# Patient Record
Sex: Male | Born: 1985 | Race: White | Hispanic: No | Marital: Married
Health system: Southern US, Academic
[De-identification: ages and names within clinical notes are randomized; demographics above are authoritative.]

## PROBLEM LIST (undated history)

## (undated) ENCOUNTER — Encounter: Attending: Nurse Practitioner | Primary: Nurse Practitioner

## (undated) ENCOUNTER — Ambulatory Visit: Payer: MEDICARE

## (undated) ENCOUNTER — Encounter: Attending: Hematology & Oncology | Primary: Hematology & Oncology

## (undated) ENCOUNTER — Ambulatory Visit

## (undated) ENCOUNTER — Telehealth

## (undated) ENCOUNTER — Encounter: Attending: Cardiovascular Disease | Primary: Cardiovascular Disease

## (undated) ENCOUNTER — Encounter

## (undated) ENCOUNTER — Encounter: Attending: Family | Primary: Family

## (undated) ENCOUNTER — Inpatient Hospital Stay

## (undated) ENCOUNTER — Ambulatory Visit: Payer: MEDICARE | Attending: Nurse Practitioner | Primary: Nurse Practitioner

## (undated) ENCOUNTER — Ambulatory Visit: Payer: MEDICARE | Attending: Cardiovascular Disease | Primary: Cardiovascular Disease

## (undated) ENCOUNTER — Other Ambulatory Visit

## (undated) ENCOUNTER — Ambulatory Visit: Payer: MEDICARE | Attending: Family | Primary: Family

## (undated) ENCOUNTER — Encounter: Attending: Clinical Cardiac Electrophysiology | Primary: Clinical Cardiac Electrophysiology

## (undated) ENCOUNTER — Telehealth: Attending: Nurse Practitioner | Primary: Nurse Practitioner

## (undated) DIAGNOSIS — I10 Essential (primary) hypertension: Secondary | ICD-10-CM

## (undated) DIAGNOSIS — M109 Gout, unspecified: Secondary | ICD-10-CM

## (undated) DIAGNOSIS — I429 Cardiomyopathy, unspecified: Secondary | ICD-10-CM

## (undated) DIAGNOSIS — Z95810 Presence of automatic (implantable) cardiac defibrillator: Secondary | ICD-10-CM

## (undated) HISTORY — PX: ICD IMPLANT: EP1208

---
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
## 2005-05-05 ENCOUNTER — Emergency Department: Payer: Self-pay | Admitting: Emergency Medicine

## 2005-05-15 ENCOUNTER — Emergency Department (HOSPITAL_COMMUNITY): Admission: EM | Admit: 2005-05-15 | Discharge: 2005-05-16 | Payer: Self-pay | Admitting: Emergency Medicine

## 2005-05-22 ENCOUNTER — Emergency Department (HOSPITAL_COMMUNITY): Admission: EM | Admit: 2005-05-22 | Discharge: 2005-05-22 | Payer: Self-pay | Admitting: Emergency Medicine

## 2005-12-21 ENCOUNTER — Observation Stay (HOSPITAL_COMMUNITY): Admission: EM | Admit: 2005-12-21 | Discharge: 2005-12-22 | Payer: Self-pay | Admitting: Emergency Medicine

## 2007-02-25 ENCOUNTER — Ambulatory Visit: Payer: Self-pay | Admitting: Family Medicine

## 2007-03-05 ENCOUNTER — Ambulatory Visit: Payer: Self-pay | Admitting: Family Medicine

## 2007-03-05 DIAGNOSIS — R5381 Other malaise: Secondary | ICD-10-CM | POA: Insufficient documentation

## 2007-03-05 DIAGNOSIS — R5383 Other fatigue: Secondary | ICD-10-CM

## 2007-03-05 DIAGNOSIS — K219 Gastro-esophageal reflux disease without esophagitis: Secondary | ICD-10-CM | POA: Insufficient documentation

## 2007-03-05 DIAGNOSIS — R05 Cough: Secondary | ICD-10-CM

## 2007-03-06 ENCOUNTER — Telehealth (INDEPENDENT_AMBULATORY_CARE_PROVIDER_SITE_OTHER): Payer: Self-pay | Admitting: *Deleted

## 2007-03-06 LAB — CONVERTED CEMR LAB
Albumin: 4.4 g/dL (ref 3.5–5.2)
CO2: 27 meq/L (ref 19–32)
Eosinophils Relative: 3 % (ref 0–5)
Glucose, Bld: 64 mg/dL — ABNORMAL LOW (ref 70–99)
HCT: 45.6 % (ref 39.0–52.0)
Lymphocytes Relative: 35 % (ref 12–46)
Lymphs Abs: 1.8 10*3/uL (ref 0.7–4.0)
Neutrophils Relative %: 51 % (ref 43–77)
Platelets: 275 10*3/uL (ref 150–400)
Potassium: 4.6 meq/L (ref 3.5–5.3)
Sodium: 142 meq/L (ref 135–145)
TSH: 1.257 microintl units/mL (ref 0.350–5.50)
Total Protein: 7.1 g/dL (ref 6.0–8.3)
WBC: 5 10*3/uL (ref 4.0–10.5)

## 2007-03-08 ENCOUNTER — Encounter (INDEPENDENT_AMBULATORY_CARE_PROVIDER_SITE_OTHER): Payer: Self-pay | Admitting: Family Medicine

## 2007-05-26 ENCOUNTER — Encounter (INDEPENDENT_AMBULATORY_CARE_PROVIDER_SITE_OTHER): Payer: Self-pay | Admitting: Family Medicine

## 2010-09-07 NOTE — H&P (Signed)
NAMETERI, DILTZ         ACCOUNT NO.:  [id number]   MEDICAL RECORD NO.:  [id number]          PATIENT TYPE:  OBV   LOCATION:  1824                         FACILITY:  [facility]   PHYSICIAN:  Corinna L. Lendell Caprice, MDDATE OF BIRTH:  08-08-1985   DATE OF ADMISSION:  12/21/2005  DATE OF DISCHARGE:                                HISTORY & PHYSICAL   CHIEF COMPLAINT:  Chemical exposure.   HISTORY OF PRESENT ILLNESS:  Mr. Meister is a 25 year old, unassigned, white  male who presents to the emergency room after having woken up from a nap.  His mother was mixing pool chemicals and he reportedly felt some slight  shortness of breath and pain with inspiration.  He is somnolent and does not  provide a lot of history.  He has no history of asthma.  He has no rash.  He  did not take any medications other than as prescribed today.  Apparently,  his some of his family members presented to the emergency room as well for  the chemical exposure.   PAST MEDICAL HISTORY:  1. ADD.  2. Bipolar disorder.  3. Hypertension.   MEDICATIONS:  1. Strattera, dose unknown.  2. Hydrochlorothiazide, dose unknown.  3. Lexapro.  He believes 10 mg a day.  4. Ativan, dose unknown.  He thinks his doctor might have taken him off      this recently.   NO KNOWN DRUG ALLERGIES.   SOCIAL HISTORY:  He denies drinking, smoking, drug abuse.  He lives with his  family.  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   FAMILY HISTORY:  Noncontributory.   REVIEW OF SYSTEMS:  As above.  Otherwise negative.   PHYSICAL EXAMINATION:  VITAL SIGNS:  His temperature is 97.6, blood pressure  131/65, pulse 99, respiratory rate 20, oxygen saturation initially was 90%  on room air and is currently 94% on 2 liters nasal cannula oxygen.  He  reportedly drops his oxygen saturation into the 80s while off oxygen  according to the ED physician.  GENERAL:  The patient is a somnolent white male, easily arousable.  HEENT:  Normocephalic atraumatic.  Pupils  are equal, round, and reactive to  light.  Sclerae nonicteric.  Moist mucous membranes.  NECK:  Supple.  No lymphadenopathy.  LUNGS:  Clear to auscultation bilaterally without wheezes, rhonchi, or  rales.  CARDIOVASCULAR:  Regular rate and rhythm without murmurs, gallops, rubs.  ABDOMEN:  Normal bowel sounds.  Soft, nontender, nondistended.  GU:  Deferred.  RECTAL:  Deferred.  EXTREMITIES:  No clubbing, cyanosis, or edema.  SKIN:  No rash.  PSYCHIATRIC:  Cooperative.  NEUROLOGIC:  Sleepy but oriented.  Cranial nerves and sensory motor exams  were intact.   LABORATORY:  H&H normal.  Sodium 139, potassium 3.4, chloride 104,  bicarbonate 27, glucose 126, BUN 22, creatinine 0.9.  Urine drug screen  positive for benzodiazepines.  Chest x-ray shows nothing acute.   ASSESSMENT/PLAN:  1. Chlorine exposure.  It is uncertain what types of chemicals were being      mixed but the emergency department physician discussed the case with      Poison Control.  They recommended admission for observation as he is at      risk for a chemical pneumonitis.  This is reasonable particularly given      his borderline oxygen saturations.  I will check an ABG, place him on      observation on telemetry, check another chest x-ray in the morning, do      neruo checks every four hours, and hold his medications for now.  2. Hypertension.  3. Bipolar disorder.  4. Attention deficit disorder.      Corinna L. Lendell Caprice, MD  Electronically Signed     CLS/MEDQ  D:  12/21/2005  T:  12/21/2005  Job:  [id number]

## 2010-12-22 ENCOUNTER — Emergency Department: Payer: Self-pay | Admitting: Emergency Medicine

## 2012-09-22 DIAGNOSIS — I421 Obstructive hypertrophic cardiomyopathy: Secondary | ICD-10-CM | POA: Diagnosis present

## 2014-06-07 ENCOUNTER — Ambulatory Visit (HOSPITAL_COMMUNITY)
Admission: RE | Admit: 2014-06-07 | Discharge: 2014-06-07 | Disposition: A | Discharge status: Home / Self Care | Payer: Medicare Other | Source: Ambulatory Visit | Attending: Family Medicine | Admitting: Family Medicine

## 2014-06-07 ENCOUNTER — Other Ambulatory Visit (HOSPITAL_COMMUNITY): Payer: Self-pay | Admitting: Family Medicine

## 2014-06-07 DIAGNOSIS — J189 Pneumonia, unspecified organism: Secondary | ICD-10-CM | POA: Insufficient documentation

## 2014-09-05 ENCOUNTER — Other Ambulatory Visit (HOSPITAL_COMMUNITY): Payer: Self-pay | Admitting: Family Medicine

## 2014-09-05 ENCOUNTER — Ambulatory Visit (HOSPITAL_COMMUNITY)
Admission: RE | Admit: 2014-09-05 | Discharge: 2014-09-05 | Disposition: A | Discharge status: Home / Self Care | Payer: Medicare Other | Source: Ambulatory Visit | Attending: Family Medicine | Admitting: Family Medicine

## 2014-09-05 DIAGNOSIS — M79671 Pain in right foot: Secondary | ICD-10-CM | POA: Diagnosis present

## 2016-02-14 ENCOUNTER — Emergency Department (HOSPITAL_COMMUNITY)
Admission: EM | Admit: 2016-02-14 | Discharge: 2016-02-14 | Disposition: A | Discharge status: Home / Self Care | Payer: Medicare Other | Attending: Emergency Medicine | Admitting: Emergency Medicine

## 2016-02-14 ENCOUNTER — Encounter (HOSPITAL_COMMUNITY): Payer: Self-pay | Admitting: Emergency Medicine

## 2016-02-14 ENCOUNTER — Emergency Department (HOSPITAL_COMMUNITY): Payer: Medicare Other

## 2016-02-14 DIAGNOSIS — Z79899 Other long term (current) drug therapy: Secondary | ICD-10-CM | POA: Diagnosis not present

## 2016-02-14 DIAGNOSIS — S9032XA Contusion of left foot, initial encounter: Secondary | ICD-10-CM | POA: Diagnosis not present

## 2016-02-14 DIAGNOSIS — Y999 Unspecified external cause status: Secondary | ICD-10-CM | POA: Insufficient documentation

## 2016-02-14 DIAGNOSIS — Y9389 Activity, other specified: Secondary | ICD-10-CM | POA: Insufficient documentation

## 2016-02-14 DIAGNOSIS — W208XXA Other cause of strike by thrown, projected or falling object, initial encounter: Secondary | ICD-10-CM | POA: Diagnosis not present

## 2016-02-14 DIAGNOSIS — Y929 Unspecified place or not applicable: Secondary | ICD-10-CM | POA: Diagnosis not present

## 2016-02-14 DIAGNOSIS — S99922A Unspecified injury of left foot, initial encounter: Secondary | ICD-10-CM | POA: Diagnosis present

## 2016-02-14 HISTORY — DX: Presence of automatic (implantable) cardiac defibrillator: Z95.810

## 2016-02-14 HISTORY — DX: Cardiomyopathy, unspecified: I42.9

## 2016-02-14 NOTE — ED Provider Notes (Signed)
[facility] DEPT Provider Note   CSN: [id number] Arrival date & time: 02/14/16  1831     History   Chief Complaint Chief Complaint  Patient presents with  . Foot Pain    HPI Christopher Nichols is a 30 y.o. male.  The history is provided by the patient. No language interpreter was used.  Foot Pain  This is a new problem. The current episode started 6 to 12 hours ago. The problem occurs constantly. The problem has been gradually worsening. Nothing aggravates the symptoms. Nothing relieves the symptoms. He has tried nothing for the symptoms.  Pt reports he was cutting a tree limb and it fell on his foot.  Pt complains of swelling and bruising.  Past Medical History:  Diagnosis Date  . Cardiac defibrillator in place   . Cardiomyopathy [facility](HCC)     Patient Active Problem List   Diagnosis Date Noted  . GERD 03/05/2007  . MALAISE AND FATIGUE 03/05/2007  . COUGH 03/05/2007    History reviewed. No pertinent surgical history.     Home Medications    Prior to Admission medications   Medication Sig Start Date End Date Taking? Authorizing Provider  cetirizine (ZYRTEC) 10 MG tablet Take 10 mg by mouth daily.   Yes Historical Provider, MD  metoprolol (LOPRESSOR) 100 MG tablet Take 100 mg by mouth 2 (two) times daily.  08/24/15  Yes Historical Provider, MD  verapamil (VERELAN) 240 MG 24 hr capsule Take 240 mg by mouth 2 (two) times daily.   Yes Historical Provider, MD    Family History History reviewed. No pertinent family history.  Social History Social History  Substance Use Topics  . Smoking status: Never Smoker  . Smokeless tobacco: Never Used  . Alcohol use No     Allergies   Clarithromycin   Review of Systems Review of Systems  All other systems reviewed and are negative.    Physical Exam Updated Vital Signs BP 125/66 (BP Location: Right Arm)   Pulse 74   Temp 98.4 F (36.9 C) (Oral)   Resp 18   Ht 6' (1.829 m)   Wt 104.3 kg   SpO2 98%   BMI  31.19 kg/m   Physical Exam  Constitutional: He appears well-developed and well-nourished.  HENT:  Head: Normocephalic.  Musculoskeletal: He exhibits tenderness. He exhibits no deformity.  Bruising dorsal aspect of left foot,  Pain to palpation nv and ns intact  Neurological: He is alert.  Skin: Skin is warm.  Nursing note and vitals reviewed.    ED Treatments / Results  Labs (all labs ordered are listed, but only abnormal results are displayed) Labs Reviewed - No data to display  EKG  EKG Interpretation None       Radiology Dg Foot Complete Left  Result Date: 02/14/2016 CLINICAL DATA:  Fall, foot laceration EXAM: LEFT FOOT - COMPLETE 3+ VIEW COMPARISON:  None. FINDINGS: No fracture or dislocation is seen. The joint spaces are preserved. Mild soft tissue swelling overlying the dorsal forefoot. No radiopaque foreign body is seen. IMPRESSION: No fracture, dislocation, or radiopaque foreign body is seen. Mild dorsal soft tissue swelling. Electronically Signed   By: Charline BillsSriyesh  Krishnan M.D.   On: 02/14/2016 19:39    Procedures Procedures (including critical care time)  Medications Ordered in ED Medications - No data to display   Initial Impression / Assessment and Plan / ED Course  I have reviewed the triage vital signs and the nursing notes.  Pertinent labs & imaging results  that were available during my care of the patient were reviewed by me and considered in my medical decision making (see chart for details).  Clinical Course      Final Clinical Impressions(s) / ED Diagnoses   Final diagnoses:  Contusion of left foot, initial encounter    New Prescriptions Discharge Medication List as of 02/14/2016  8:49 PM    ace wrap Pt advised to followup for recheck with his MD if pain persist past one week.   Ice An After Visit Summary was printed and given to the patient.   Lonia Skinner [place], PA-C 02/14/16 2346    Mancel Bale, MD 02/15/16 [phone]

## 2016-02-14 NOTE — ED Triage Notes (Signed)
Patient states he was cutting limbs and a large limb fell on his left foot.

## 2016-02-14 NOTE — Discharge Instructions (Signed)
See your Physician for recheck.  Return if any problems.  °

## 2016-02-14 NOTE — ED Notes (Signed)
Pt ambulatory to restroom at this time. 

## 2016-11-19 ENCOUNTER — Ambulatory Visit
Admission: RE | Admit: 2016-11-19 | Discharge: 2016-11-19 | Disposition: A | Discharge status: Home / Self Care | Payer: MEDICARE | Attending: Cardiovascular Disease | Admitting: Cardiovascular Disease

## 2016-11-19 DIAGNOSIS — I421 Obstructive hypertrophic cardiomyopathy: Principal | ICD-10-CM

## 2017-01-01 MED ORDER — VERAPAMIL ER 240 MG 24 HR CAPSULE,EXTENDED RELEASE
ORAL_CAPSULE | 2 refills | 0 days | Status: CP
Start: 2017-01-01 — End: 2017-05-09

## 2017-02-18 ENCOUNTER — Other Ambulatory Visit (HOSPITAL_COMMUNITY): Payer: Self-pay | Admitting: Family Medicine

## 2017-02-18 ENCOUNTER — Ambulatory Visit (HOSPITAL_COMMUNITY)
Admission: RE | Admit: 2017-02-18 | Discharge: 2017-02-18 | Disposition: A | Discharge status: Home / Self Care | Payer: Medicare Other | Source: Ambulatory Visit | Attending: Family Medicine | Admitting: Family Medicine

## 2017-02-18 DIAGNOSIS — R05 Cough: Secondary | ICD-10-CM | POA: Diagnosis not present

## 2017-02-18 DIAGNOSIS — R059 Cough, unspecified: Secondary | ICD-10-CM

## 2017-03-10 MED ORDER — METOPROLOL TARTRATE 100 MG TABLET
ORAL_TABLET | 1 refills | 0 days | Status: CP
Start: 2017-03-10 — End: 2018-01-06

## 2017-03-11 ENCOUNTER — Ambulatory Visit
Admission: RE | Admit: 2017-03-11 | Discharge: 2017-03-11 | Disposition: A | Discharge status: Home / Self Care | Payer: MEDICARE | Attending: Adult Health | Admitting: Adult Health

## 2017-03-11 DIAGNOSIS — I429 Cardiomyopathy, unspecified: Principal | ICD-10-CM

## 2017-05-09 ENCOUNTER — Ambulatory Visit
Admit: 2017-05-09 | Discharge: 2017-05-10 | Payer: MEDICARE | Attending: Cardiovascular Disease | Primary: Cardiovascular Disease

## 2017-05-09 DIAGNOSIS — R002 Palpitations: Principal | ICD-10-CM

## 2017-05-09 MED ORDER — CEPHALEXIN 500 MG CAPSULE
ORAL_CAPSULE | Freq: Three times a day (TID) | ORAL | 0 refills | 0 days | Status: CP
Start: 2017-05-09 — End: 2017-05-14

## 2017-05-13 MED ORDER — VERAPAMIL ER 240 MG 24 HR CAPSULE,EXTENDED RELEASE
ORAL_CAPSULE | 0 refills | 0 days | Status: CP
Start: 2017-05-13 — End: 2017-05-20

## 2017-05-20 ENCOUNTER — Ambulatory Visit
Admit: 2017-05-20 | Discharge: 2017-05-21 | Payer: MEDICARE | Attending: Cardiovascular Disease | Primary: Cardiovascular Disease

## 2017-05-20 DIAGNOSIS — I421 Obstructive hypertrophic cardiomyopathy: Principal | ICD-10-CM

## 2017-05-20 DIAGNOSIS — R002 Palpitations: Secondary | ICD-10-CM

## 2017-05-20 MED ORDER — METOPROLOL TARTRATE 100 MG TABLET
ORAL_TABLET | Freq: Two times a day (BID) | ORAL | 3 refills | 0 days | Status: CP
Start: 2017-05-20 — End: 2018-01-15

## 2017-05-20 MED ORDER — VERAPAMIL ER 240 MG 24 HR CAPSULE,EXTENDED RELEASE
ORAL_CAPSULE | Freq: Two times a day (BID) | ORAL | 3 refills | 0.00000 days | Status: CP
Start: 2017-05-20 — End: 2018-01-06

## 2018-01-06 ENCOUNTER — Ambulatory Visit: Admit: 2018-01-06 | Discharge: 2018-01-07 | Payer: MEDICARE

## 2018-01-06 DIAGNOSIS — K219 Gastro-esophageal reflux disease without esophagitis: Secondary | ICD-10-CM

## 2018-01-06 DIAGNOSIS — R0602 Shortness of breath: Secondary | ICD-10-CM

## 2018-01-06 DIAGNOSIS — R079 Chest pain, unspecified: Secondary | ICD-10-CM

## 2018-01-06 DIAGNOSIS — I319 Disease of pericardium, unspecified: Secondary | ICD-10-CM

## 2018-01-06 DIAGNOSIS — R002 Palpitations: Secondary | ICD-10-CM

## 2018-01-06 DIAGNOSIS — I421 Obstructive hypertrophic cardiomyopathy: Principal | ICD-10-CM

## 2018-01-06 DIAGNOSIS — F70 Mild intellectual disabilities: Secondary | ICD-10-CM

## 2018-01-06 MED ORDER — METOPROLOL TARTRATE 100 MG TABLET
ORAL_TABLET | Freq: Two times a day (BID) | ORAL | 3 refills | 0.00000 days | Status: CP
Start: 2018-01-06 — End: ?

## 2018-01-06 MED ORDER — OMEPRAZOLE 20 MG CAPSULE,DELAYED RELEASE
ORAL_CAPSULE | Freq: Two times a day (BID) | ORAL | 3 refills | 0.00000 days | Status: CP
Start: 2018-01-06 — End: ?

## 2018-01-06 MED ORDER — VERAPAMIL ER 240 MG 24 HR CAPSULE,EXTENDED RELEASE
ORAL_CAPSULE | Freq: Two times a day (BID) | ORAL | 3 refills | 0 days | Status: CP
Start: 2018-01-06 — End: ?

## 2018-01-15 ENCOUNTER — Ambulatory Visit
Admit: 2018-01-15 | Discharge: 2018-01-16 | Payer: MEDICARE | Attending: Cardiovascular Disease | Primary: Cardiovascular Disease

## 2018-01-15 DIAGNOSIS — R002 Palpitations: Principal | ICD-10-CM

## 2018-05-06 ENCOUNTER — Institutional Professional Consult (permissible substitution): Admit: 2018-05-06 | Discharge: 2018-05-07 | Payer: MEDICARE

## 2018-05-06 DIAGNOSIS — Z45018 Encounter for adjustment and management of other part of cardiac pacemaker: Principal | ICD-10-CM

## 2018-06-23 ENCOUNTER — Ambulatory Visit: Admit: 2018-06-23 | Discharge: 2018-06-24 | Payer: MEDICARE

## 2018-06-23 DIAGNOSIS — I421 Obstructive hypertrophic cardiomyopathy: Principal | ICD-10-CM

## 2018-06-23 DIAGNOSIS — I10 Essential (primary) hypertension: Principal | ICD-10-CM

## 2018-06-23 DIAGNOSIS — F79 Unspecified intellectual disabilities: Principal | ICD-10-CM

## 2018-06-23 DIAGNOSIS — R002 Palpitations: Principal | ICD-10-CM

## 2018-06-23 DIAGNOSIS — I422 Other hypertrophic cardiomyopathy: Principal | ICD-10-CM

## 2018-06-23 DIAGNOSIS — F909 Attention-deficit hyperactivity disorder, unspecified type: Principal | ICD-10-CM

## 2019-03-23 ENCOUNTER — Ambulatory Visit: Admit: 2019-03-23 | Discharge: 2019-03-23 | Payer: MEDICARE

## 2019-03-23 DIAGNOSIS — F70 Mild intellectual disabilities: Principal | ICD-10-CM

## 2019-03-23 DIAGNOSIS — I421 Obstructive hypertrophic cardiomyopathy: Principal | ICD-10-CM

## 2019-03-23 DIAGNOSIS — R002 Palpitations: Principal | ICD-10-CM

## 2019-03-23 DIAGNOSIS — R079 Chest pain, unspecified: Principal | ICD-10-CM

## 2019-03-23 DIAGNOSIS — R0602 Shortness of breath: Principal | ICD-10-CM

## 2019-04-01 ENCOUNTER — Other Ambulatory Visit: Payer: Self-pay

## 2019-04-01 DIAGNOSIS — Z20822 Contact with and (suspected) exposure to covid-19: Secondary | ICD-10-CM

## 2019-04-02 ENCOUNTER — Telehealth: Payer: Self-pay | Admitting: *Deleted

## 2019-04-02 NOTE — Telephone Encounter (Signed)
Patient called for results ,still pending . 

## 2019-04-03 LAB — NOVEL CORONAVIRUS, NAA: SARS-CoV-2, NAA: NOT DETECTED

## 2019-04-21 ENCOUNTER — Ambulatory Visit: Payer: Medicare Other | Attending: Internal Medicine

## 2019-04-21 DIAGNOSIS — Z20822 Contact with and (suspected) exposure to covid-19: Secondary | ICD-10-CM

## 2019-04-22 ENCOUNTER — Telehealth: Payer: Self-pay

## 2019-04-22 NOTE — Telephone Encounter (Signed)
Patient called in requesting [facility] lab results  - DOB/Address verified - Results pending.  Reviewed testing process with patient; assisted with Mychart setup, no further questions.

## 2019-04-23 LAB — NOVEL CORONAVIRUS, NAA: SARS-CoV-2, NAA: DETECTED — AB

## 2019-05-10 ENCOUNTER — Other Ambulatory Visit: Payer: Self-pay

## 2019-05-10 ENCOUNTER — Ambulatory Visit
Admission: EM | Admit: 2019-05-10 | Discharge: 2019-05-10 | Disposition: A | Discharge status: Home / Self Care | Payer: Medicare Other

## 2019-05-10 DIAGNOSIS — M109 Gout, unspecified: Secondary | ICD-10-CM | POA: Diagnosis not present

## 2019-05-10 MED ORDER — COLCHICINE 0.6 MG PO TABS: 0.6000 mg | ORAL_TABLET | Freq: Every day | ORAL | 0 refills | Status: DC

## 2019-05-10 NOTE — Discharge Instructions (Addendum)
Rest, ice, and elevate Prescribed colchicine take as directed and to completion Take two doses and then wait a hour and then take third dose Follow up with PCP for further evaluation and management Return or go to the ER if you have any new or worsening symptoms (fever, chills, chest pain, redness, swelling, worsening symptoms despite medication, etc...)

## 2019-05-10 NOTE — ED Provider Notes (Signed)
[facility]ARE [facility]   [id number] 05/10/19 Arrival Time: 1315  CC: Hand PAIN  SUBJECTIVE: History from: patient. Christopher Nichols is a 34 y.o. male hx significant for cardiomyopathy with defibrillator, complains of left great toe pain that began 4 days ago.  Denies a precipitating event or specific injury.  Localizes the pain to the LT great toe.  Describes the pain as intermittent and sharp in character.  Has tried OTC medications without relief.  Symptoms are made worse with weight-bearing.  Reports similar symptoms in the past with gout and treated with colchicine with relief.  Was taking verapamil at that time as well.  Complains of associated swelling, and redness, warm to the touch.  Denies fever, chills, ecchymosis, weakness, numbness and tingling.    ROS: As per HPI.  All other pertinent ROS negative.     Past Medical History:  Diagnosis Date  . Cardiac defibrillator in place   . Cardiomyopathy [facility])    History reviewed. No pertinent surgical history. Allergies  Allergen Reactions  . Clarithromycin     REACTION: Stomach upset   No current facility-administered medications on file prior to encounter.   Current Outpatient Medications on File Prior to Encounter  Medication Sig Dispense Refill  . naproxen sodium (ALEVE) 220 MG tablet Take 220 mg by mouth.    Marland Kitchen omeprazole (PRILOSEC) 20 MG capsule Take 20 mg by mouth daily.    . cetirizine (ZYRTEC) 10 MG tablet Take 10 mg by mouth daily.    . metoprolol (LOPRESSOR) 100 MG tablet Take 100 mg by mouth 2 (two) times daily.     . verapamil (VERELAN) 240 MG 24 hr capsule Take 240 mg by mouth 2 (two) times daily.     Social History   Socioeconomic History  . Marital status: Single    Spouse name: Not on file  . Number of children: Not on file  . Years of education: Not on file  . Highest education level: Not on file  Occupational History  . Not on file  Tobacco Use  . Smoking status: Never Smoker  . Smokeless tobacco:  Never Used  Substance and Sexual Activity  . Alcohol use: No  . Drug use: No  . Sexual activity: Not on file  Other Topics Concern  . Not on file  Social History Narrative  . Not on file   Social Determinants of Health   Financial Resource Strain:   . Difficulty of Paying Living Expenses: Not on file  Food Insecurity:   . Worried About Programme researcher, broadcasting/film/video in the Last Year: Not on file  . Ran Out of Food in the Last Year: Not on file  Transportation Needs:   . Lack of Transportation (Medical): Not on file  . Lack of Transportation (Non-Medical): Not on file  Physical Activity:   . Days of Exercise per Week: Not on file  . Minutes of Exercise per Session: Not on file  Stress:   . Feeling of Stress : Not on file  Social Connections:   . Frequency of Communication with Friends and Family: Not on file  . Frequency of Social Gatherings with Friends and Family: Not on file  . Attends Religious Services: Not on file  . Active Member of Clubs or Organizations: Not on file  . Attends Banker Meetings: Not on file  . Marital Status: Not on file  Intimate Partner Violence:   . Fear of Current or Ex-Partner: Not on file  . Emotionally Abused:  Not on file  . Physically Abused: Not on file  . Sexually Abused: Not on file   Family History  Problem Relation Age of Onset  . Healthy Mother   . Healthy Father     OBJECTIVE:  Vitals:   05/10/19 1408  BP: 111/76  Pulse: 61  Resp: 16  Temp: 97.8 F (36.6 C)  TempSrc: Oral  SpO2: 97%    General appearance: ALERT; in no acute distress.  Head: NCAT Lungs: Normal respiratory effort CV: Dorsalis pedis pulses 2+. Cap refill < 2 seconds Musculoskeletal: RT foot Inspection: Skin warm, dry, clear and intact without obvious erythema, effusion, or ecchymosis.  Palpation: TTP over 1st MTP joint ROM: LROM about the great toe Strength: 5/5 dorsiflexion, 5/5 plantar flexion Skin: warm and dry Neurologic: Ambulates without  difficulty; Sensation intact about the lower extremities Psychological: alert and cooperative; normal mood and affect   ASSESSMENT & PLAN:  1. Acute gout involving toe of left foot, unspecified cause     Meds ordered this encounter  Medications  . colchicine 0.6 MG tablet    Sig: Take 1 tablet (0.6 mg total) by mouth daily.    Dispense:  10 tablet    Refill:  0    Order Specific Question:   Supervising Provider    Answer:   Raylene Everts [[id number]]   Rest, ice, and elevate Prescribed colchicine take as directed and to completion Offered patient and mother alternative to colchicine given possible interaction with verapamil.  States patient has tolerated colchicine in the past without complications.  Aware of risk of associated with decision.  Would like to proceed with colchicine.   Take two doses and then wait a hour and then take third dose Follow up with PCP for further evaluation and management Return or go to the ER if you have any new or worsening symptoms (fever, chills, chest pain, redness, swelling, worsening symptoms despite medication, etc...)    Reviewed expectations re: course of current medical issues. Questions answered. Outlined signs and symptoms indicating need for more acute intervention. Patient verbalized understanding. After Visit Summary given.    Lestine Box, PA-C 05/10/19 1700

## 2019-05-10 NOTE — ED Triage Notes (Signed)
Pt presents to UC w/ c/o gout flare up in left leg x5 days. Pt had covid 2 weeks ago and mother believes it is flaring up his gout which he hasn't had in years.

## 2019-07-18 ENCOUNTER — Ambulatory Visit: Payer: Medicare Other

## 2019-10-05 ENCOUNTER — Ambulatory Visit: Admit: 2019-10-05 | Discharge: 2019-10-06 | Payer: MEDICARE

## 2019-10-05 MED ORDER — COLCHICINE 0.6 MG TABLET
ORAL_TABLET | Freq: Every day | ORAL | 3 refills | 30.00000 days | Status: CP
Start: 2019-10-05 — End: 2019-11-04

## 2019-10-05 MED ORDER — VERAPAMIL ER 240 MG 24 HR CAPSULE,EXTENDED RELEASE
ORAL_CAPSULE | Freq: Two times a day (BID) | ORAL | 3 refills | 0 days | Status: CP
Start: 2019-10-05 — End: ?

## 2019-10-05 MED ORDER — METOPROLOL TARTRATE 100 MG TABLET
ORAL_TABLET | Freq: Two times a day (BID) | ORAL | 3 refills | 90 days | Status: CP
Start: 2019-10-05 — End: ?

## 2019-10-06 MED ORDER — COLCHICINE 0.6 MG TABLET
ORAL_TABLET | Freq: Every day | ORAL | 3 refills | 90.00000 days | Status: CP
Start: 2019-10-06 — End: 2020-10-05

## 2019-10-07 ENCOUNTER — Ambulatory Visit
Admit: 2019-10-07 | Discharge: 2019-10-08 | Payer: MEDICARE | Attending: Cardiovascular Disease | Primary: Cardiovascular Disease

## 2019-12-23 ENCOUNTER — Ambulatory Visit
Admission: EM | Admit: 2019-12-23 | Discharge: 2019-12-23 | Disposition: A | Discharge status: Home / Self Care | Payer: Medicare Other | Attending: Emergency Medicine | Admitting: Emergency Medicine

## 2019-12-23 ENCOUNTER — Encounter: Payer: Self-pay | Admitting: Emergency Medicine

## 2019-12-23 ENCOUNTER — Other Ambulatory Visit: Payer: Self-pay

## 2019-12-23 DIAGNOSIS — S60453A Superficial foreign body of left middle finger, initial encounter: Secondary | ICD-10-CM

## 2019-12-23 MED ORDER — TETANUS-DIPHTH-ACELL PERTUSSIS 5-2.5-18.5 LF-MCG/0.5 IM SUSP
0.5000 mL | Freq: Once | INTRAMUSCULAR | Status: AC
  Administered 2019-12-23: 0.5 mL via INTRAMUSCULAR

## 2019-12-23 MED ORDER — MUPIROCIN 2 % EX OINT: 1.0000 "application " | TOPICAL_OINTMENT | Freq: Two times a day (BID) | CUTANEOUS | 0 refills | Status: DC

## 2019-12-23 MED ORDER — CEPHALEXIN 500 MG PO CAPS: 500.0000 mg | ORAL_CAPSULE | Freq: Four times a day (QID) | ORAL | 0 refills | Status: DC

## 2019-12-23 NOTE — ED Triage Notes (Signed)
Fishing lure stuck in Lt middle finger

## 2019-12-23 NOTE — ED Provider Notes (Signed)
[facility] CARE [facility]ENTER   [id number] 12/23/19 Arrival Time: 1739   BJ:[id number]  SUBJECTIVE:  Christopher Nichols is a 34 y.o. male who presented to the urgent care with a complaint of fishing hook stuck in the left middle finger that occurred today.  Report he was fishing and mistakenly grabbed the hook.  Located pain to his left middle finger.  Pain is worsening aching character.  Has not tried any OTC medication.  Denies similar symptoms in the past.  Denies chills, fever, nausea, vomiting, diarrhea.  Td Immunization status is unknown  ROS: As per HPI.  All other pertinent ROS negative.     Past Medical History:  Diagnosis Date  . Cardiac defibrillator in place   . Cardiomyopathy [facility])    History reviewed. No pertinent surgical history. Allergies  Allergen Reactions  . Clarithromycin     REACTION: Stomach upset   No current facility-administered medications on file prior to encounter.   Current Outpatient Medications on File Prior to Encounter  Medication Sig Dispense Refill  . cetirizine (ZYRTEC) 10 MG tablet Take 10 mg by mouth daily.    . colchicine 0.6 MG tablet Take 1 tablet (0.6 mg total) by mouth daily. 10 tablet 0  . metoprolol (LOPRESSOR) 100 MG tablet Take 100 mg by mouth 2 (two) times daily.     . naproxen sodium (ALEVE) 220 MG tablet Take 220 mg by mouth.    Marland Kitchen omeprazole (PRILOSEC) 20 MG capsule Take 20 mg by mouth daily.    . verapamil (VERELAN) 240 MG 24 hr capsule Take 240 mg by mouth 2 (two) times daily.     Social History   Socioeconomic History  . Marital status: Single    Spouse name: Not on file  . Number of children: Not on file  . Years of education: Not on file  . Highest education level: Not on file  Occupational History  . Not on file  Tobacco Use  . Smoking status: Never Smoker  . Smokeless tobacco: Never Used  Substance and Sexual Activity  . Alcohol use: No  . Drug use: No  . Sexual activity: Not on file  Other Topics Concern  . Not  on file  Social History Narrative  . Not on file   Social Determinants of Health   Financial Resource Strain:   . Difficulty of Paying Living Expenses: Not on file  Food Insecurity:   . Worried About Programme researcher, broadcasting/film/video in the Last Year: Not on file  . Ran Out of Food in the Last Year: Not on file  Transportation Needs:   . Lack of Transportation (Medical): Not on file  . Lack of Transportation (Non-Medical): Not on file  Physical Activity:   . Days of Exercise per Week: Not on file  . Minutes of Exercise per Session: Not on file  Stress:   . Feeling of Stress : Not on file  Social Connections:   . Frequency of Communication with Friends and Family: Not on file  . Frequency of Social Gatherings with Friends and Family: Not on file  . Attends Religious Services: Not on file  . Active Member of Clubs or Organizations: Not on file  . Attends Banker Meetings: Not on file  . Marital Status: Not on file  Intimate Partner Violence:   . Fear of Current or Ex-Partner: Not on file  . Emotionally Abused: Not on file  . Physically Abused: Not on file  . Sexually Abused: Not on file  Family History  Problem Relation Age of Onset  . Healthy Mother   . Healthy Father     OBJECTIVE:  Vitals:   12/23/19 1755  BP: 115/70  Pulse: 64  Resp: 16  Temp: 98.5 F (36.9 C)  TempSrc: Oral  SpO2: 98%     Physical Exam Vitals and nursing note reviewed.  Constitutional:      General: He is not in acute distress.    Appearance: Normal appearance. He is normal weight. He is not ill-appearing, toxic-appearing or diaphoretic.  Cardiovascular:     Rate and Rhythm: Normal rate and regular rhythm.     Pulses: Normal pulses.     Heart sounds: Normal heart sounds. No murmur heard.  No friction rub. No gallop.   Pulmonary:     Effort: Pulmonary effort is normal. No respiratory distress.     Breath sounds: Normal breath sounds. No stridor. No wheezing, rhonchi or rales.  Chest:       Chest wall: No tenderness.  Skin:    General: Skin is warm.     Capillary Refill: Capillary refill takes less than 2 seconds.     Comments: Fishing hook stuck into left anterior middle finger with a depth of 0.5 cm  Neurological:     Mental Status: He is alert and oriented to person, place, and time.     Procedure: Verbal consent obtained. Area was cleaned with betadine. Lidocaine 2% without epinephrine used to obtain local anesthesia. The nearest part of the skin near the hook was incised with a #11 blade scalpel.  The hook was then pushed out and then removed from the finger.  The wound was cleaned and dressed  with a clean gauze dressing. Minimal bleeding. No complications.  ASSESSMENT & PLAN:  1. Foreign body of left middle finger     Meds ordered this encounter  Medications  . Tdap (BOOSTRIX) injection 0.5 mL  . cephALEXin (KEFLEX) 500 MG capsule    Sig: Take 1 capsule (500 mg total) by mouth 4 (four) times daily.    Dispense:  20 capsule    Refill:  0  . mupirocin ointment (BACTROBAN) 2 %    Sig: Apply 1 application topically 2 (two) times daily.    Dispense:  22 g    Refill:  0   Discharge instructions  Was the site daily with warm water and mild soap Take antibiotic as prescribed and to completion Apply thin layer of Bactroban  Follow up here or with PCP if symptoms persists Return or go to the ED if you have any new or worsening symptoms increased redness, swelling, pain, nausea, vomiting, fever, chills, etc...    Reviewed expectations re: course of current medical issues. Questions answered. Outlined signs and symptoms indicating need for more acute intervention. Patient verbalized understanding. After Visit Summary given.       Note: This document was prepared using Dragon voice recognition software and may include unintentional dictation errors.    Durward Parcel, FNP 12/23/19 1850

## 2019-12-23 NOTE — Discharge Instructions (Addendum)
Was the site daily with warm water and mild soap Take antibiotic as prescribed and to completion Apply thin layer of Bactroban  Follow up here or with PCP if symptoms persists Return or go to the ED if you have any new or worsening symptoms increased redness, swelling, pain, nausea, vomiting, fever, chills, etc..Marland Kitchen

## 2020-01-12 ENCOUNTER — Institutional Professional Consult (permissible substitution): Admit: 2020-01-12 | Discharge: 2020-01-12 | Payer: MEDICARE

## 2020-01-12 DIAGNOSIS — I421 Obstructive hypertrophic cardiomyopathy: Principal | ICD-10-CM

## 2020-07-11 ENCOUNTER — Ambulatory Visit: Admit: 2020-07-11 | Discharge: 2020-07-11 | Payer: MEDICARE

## 2020-07-11 ENCOUNTER — Institutional Professional Consult (permissible substitution)
Admit: 2020-07-11 | Discharge: 2020-07-11 | Payer: MEDICARE | Attending: Nurse Practitioner | Primary: Nurse Practitioner

## 2020-07-11 DIAGNOSIS — R0602 Shortness of breath: Principal | ICD-10-CM

## 2020-07-11 DIAGNOSIS — Z4502 Encounter for adjustment and management of automatic implantable cardiac defibrillator: Principal | ICD-10-CM

## 2020-07-11 DIAGNOSIS — F70 Mild intellectual disabilities: Principal | ICD-10-CM

## 2020-07-11 DIAGNOSIS — R0683 Snoring: Principal | ICD-10-CM

## 2020-07-11 DIAGNOSIS — I421 Obstructive hypertrophic cardiomyopathy: Principal | ICD-10-CM

## 2020-07-11 DIAGNOSIS — K219 Gastro-esophageal reflux disease without esophagitis: Principal | ICD-10-CM

## 2020-07-11 MED ORDER — METOPROLOL TARTRATE 100 MG TABLET
ORAL_TABLET | Freq: Two times a day (BID) | ORAL | 3 refills | 90 days | Status: CP
Start: 2020-07-11 — End: ?

## 2020-07-11 MED ORDER — VERAPAMIL ER 240 MG 24 HR CAPSULE,EXTENDED RELEASE
ORAL_CAPSULE | Freq: Two times a day (BID) | ORAL | 3 refills | 0.00000 days | Status: CP
Start: 2020-07-11 — End: 2021-07-11

## 2020-08-18 ENCOUNTER — Institutional Professional Consult (permissible substitution)
Admit: 2020-08-18 | Discharge: 2020-08-19 | Payer: MEDICARE | Attending: Nurse Practitioner | Primary: Nurse Practitioner

## 2020-10-31 ENCOUNTER — Ambulatory Visit: Admit: 2020-10-31 | Discharge: 2020-10-31 | Payer: MEDICARE

## 2020-10-31 ENCOUNTER — Institutional Professional Consult (permissible substitution): Admit: 2020-10-31 | Discharge: 2020-10-31 | Payer: MEDICARE

## 2020-10-31 DIAGNOSIS — R002 Palpitations: Principal | ICD-10-CM

## 2020-11-01 DIAGNOSIS — Z95810 Presence of automatic (implantable) cardiac defibrillator: Secondary | ICD-10-CM | POA: Insufficient documentation

## 2020-11-20 ENCOUNTER — Institutional Professional Consult (permissible substitution)
Admit: 2020-11-20 | Discharge: 2020-11-21 | Payer: MEDICARE | Attending: Nurse Practitioner | Primary: Nurse Practitioner

## 2020-12-02 ENCOUNTER — Ambulatory Visit: Admit: 2020-12-02 | Discharge: 2020-12-03 | Disposition: A | Discharge status: Home / Self Care | Payer: MEDICARE

## 2020-12-05 DIAGNOSIS — Z4502 Encounter for adjustment and management of automatic implantable cardiac defibrillator: Principal | ICD-10-CM

## 2020-12-08 ENCOUNTER — Encounter: Admit: 2020-12-08 | Discharge: 2020-12-08 | Payer: MEDICARE

## 2020-12-08 ENCOUNTER — Encounter
Admit: 2020-12-08 | Discharge: 2020-12-08 | Payer: MEDICARE | Attending: Clinical Cardiac Electrophysiology | Primary: Clinical Cardiac Electrophysiology

## 2020-12-08 ENCOUNTER — Ambulatory Visit: Admit: 2020-12-08 | Discharge: 2020-12-08 | Payer: MEDICARE

## 2020-12-08 MED ORDER — OXYCODONE-ACETAMINOPHEN 5 MG-325 MG TABLET
ORAL_TABLET | Freq: Four times a day (QID) | ORAL | 0 refills | 4.00000 days | Status: CP | PRN
Start: 2020-12-08 — End: 2020-12-13

## 2020-12-12 ENCOUNTER — Institutional Professional Consult (permissible substitution): Admit: 2020-12-12 | Discharge: 2020-12-13 | Payer: MEDICARE

## 2020-12-12 DIAGNOSIS — Z95810 Presence of automatic (implantable) cardiac defibrillator: Principal | ICD-10-CM

## 2020-12-20 ENCOUNTER — Institutional Professional Consult (permissible substitution): Admit: 2020-12-20 | Discharge: 2020-12-20 | Payer: MEDICARE

## 2020-12-20 DIAGNOSIS — I421 Obstructive hypertrophic cardiomyopathy: Principal | ICD-10-CM

## 2021-03-26 ENCOUNTER — Ambulatory Visit: Admit: 2021-03-26 | Payer: MEDICARE | Attending: Nurse Practitioner | Primary: Nurse Practitioner

## 2021-05-07 MED ORDER — VERAPAMIL ER 240 MG 24 HR CAPSULE,EXTENDED RELEASE
ORAL_CAPSULE | Freq: Two times a day (BID) | ORAL | 3 refills | 0 days | Status: CP
Start: 2021-05-07 — End: 2022-05-07

## 2021-05-07 MED ORDER — METOPROLOL TARTRATE 100 MG TABLET
ORAL_TABLET | Freq: Two times a day (BID) | ORAL | 3 refills | 90 days | Status: CP
Start: 2021-05-07 — End: ?

## 2021-05-15 ENCOUNTER — Ambulatory Visit: Admit: 2021-05-15 | Payer: MEDICARE

## 2021-07-12 ENCOUNTER — Ambulatory Visit: Admit: 2021-07-12 | Discharge: 2021-07-13 | Payer: MEDICARE

## 2021-08-16 ENCOUNTER — Observation Stay: Payer: Medicare Other

## 2021-08-16 ENCOUNTER — Emergency Department: Payer: Medicare Other

## 2021-08-16 ENCOUNTER — Encounter: Payer: Self-pay | Admitting: Emergency Medicine

## 2021-08-16 ENCOUNTER — Ambulatory Visit
Admission: EM | Admit: 2021-08-16 | Discharge: 2021-08-16 | Disposition: A | Discharge status: Home / Self Care | Payer: Medicare Other

## 2021-08-16 ENCOUNTER — Other Ambulatory Visit: Payer: Self-pay

## 2021-08-16 ENCOUNTER — Inpatient Hospital Stay
Admission: EM | Admit: 2021-08-16 | Discharge: 2021-08-18 | DRG: 392 | Disposition: A | Discharge status: Home / Self Care | Payer: Medicare Other | Attending: Student in an Organized Health Care Education/Training Program | Admitting: Student in an Organized Health Care Education/Training Program

## 2021-08-16 DIAGNOSIS — R1011 Right upper quadrant pain: Secondary | ICD-10-CM | POA: Diagnosis not present

## 2021-08-16 DIAGNOSIS — R109 Unspecified abdominal pain: Secondary | ICD-10-CM | POA: Diagnosis present

## 2021-08-16 DIAGNOSIS — Z881 Allergy status to other antibiotic agents status: Secondary | ICD-10-CM

## 2021-08-16 DIAGNOSIS — K760 Fatty (change of) liver, not elsewhere classified: Secondary | ICD-10-CM | POA: Diagnosis present

## 2021-08-16 DIAGNOSIS — K2980 Duodenitis without bleeding: Principal | ICD-10-CM | POA: Diagnosis present

## 2021-08-16 DIAGNOSIS — Z95810 Presence of automatic (implantable) cardiac defibrillator: Secondary | ICD-10-CM

## 2021-08-16 DIAGNOSIS — R001 Bradycardia, unspecified: Secondary | ICD-10-CM | POA: Diagnosis present

## 2021-08-16 DIAGNOSIS — R778 Other specified abnormalities of plasma proteins: Secondary | ICD-10-CM | POA: Diagnosis present

## 2021-08-16 DIAGNOSIS — K219 Gastro-esophageal reflux disease without esophagitis: Secondary | ICD-10-CM | POA: Diagnosis present

## 2021-08-16 DIAGNOSIS — I421 Obstructive hypertrophic cardiomyopathy: Secondary | ICD-10-CM | POA: Diagnosis present

## 2021-08-16 DIAGNOSIS — Z79899 Other long term (current) drug therapy: Secondary | ICD-10-CM

## 2021-08-16 DIAGNOSIS — R9389 Abnormal findings on diagnostic imaging of other specified body structures: Secondary | ICD-10-CM | POA: Diagnosis present

## 2021-08-16 LAB — CBC
HCT: 39.8 % (ref 39.0–52.0)
HCT: 38.6 % — ABNORMAL LOW (ref 39.0–52.0)
Hemoglobin: 12.9 g/dL — ABNORMAL LOW (ref 13.0–17.0)
Hemoglobin: 12.7 g/dL — ABNORMAL LOW (ref 13.0–17.0)
MCH: 29.5 pg (ref 26.0–34.0)
MCH: 29.7 pg (ref 26.0–34.0)
MCHC: 32.4 g/dL (ref 30.0–36.0)
MCHC: 32.9 g/dL (ref 30.0–36.0)
MCV: 90.9 fL (ref 80.0–100.0)
MCV: 90.2 fL (ref 80.0–100.0)
Platelets: 206 10*3/uL (ref 150–400)
Platelets: 191 10*3/uL (ref 150–400)
RBC: 4.38 MIL/uL (ref 4.22–5.81)
RBC: 4.28 MIL/uL (ref 4.22–5.81)
RDW: 13.1 % (ref 11.5–15.5)
RDW: 13.1 % (ref 11.5–15.5)
WBC: 6.5 10*3/uL (ref 4.0–10.5)
WBC: 4.7 10*3/uL (ref 4.0–10.5)
nRBC: 0 % (ref 0.0–0.2)
nRBC: 0 % (ref 0.0–0.2)

## 2021-08-16 LAB — COMPREHENSIVE METABOLIC PANEL
ALT: 112 U/L — ABNORMAL HIGH (ref 0–44)
AST: 71 U/L — ABNORMAL HIGH (ref 15–41)
Albumin: 3.9 g/dL (ref 3.5–5.0)
Alkaline Phosphatase: 47 U/L (ref 38–126)
Anion gap: 8 (ref 5–15)
BUN: 25 mg/dL — ABNORMAL HIGH (ref 6–20)
CO2: 23 mmol/L (ref 22–32)
Calcium: 9 mg/dL (ref 8.9–10.3)
Chloride: 107 mmol/L (ref 98–111)
Creatinine, Ser: 1.1 mg/dL (ref 0.61–1.24)
GFR, Estimated: >60 mL/min (ref >60)
Glucose, Bld: 105 mg/dL — ABNORMAL HIGH (ref 70–99)
Potassium: 4.7 mmol/L (ref 3.5–5.1)
Sodium: 138 mmol/L (ref 135–145)
Total Bilirubin: 1 mg/dL (ref 0.3–1.2)
Total Protein: 6.3 g/dL — ABNORMAL LOW (ref 6.5–8.1)

## 2021-08-16 LAB — RETICULOCYTES
Immature Retic Fract: 22.3 % — ABNORMAL HIGH (ref 2.3–15.9)
RBC.: 4.25 MIL/uL (ref 4.22–5.81)
Retic Count, Absolute: 115.6 10*3/uL (ref 19.0–186.0)
Retic Ct Pct: 2.7 % (ref 0.4–3.1)

## 2021-08-16 LAB — HEMOGLOBIN A1C
Hgb A1c MFr Bld: 5 % (ref 4.8–5.6)
Mean Plasma Glucose: 96.8 mg/dL

## 2021-08-16 LAB — URINALYSIS, ROUTINE W REFLEX MICROSCOPIC
Bilirubin Urine: NEGATIVE
Glucose, UA: NEGATIVE mg/dL
Hgb urine dipstick: NEGATIVE
Ketones, ur: NEGATIVE mg/dL
Leukocytes,Ua: NEGATIVE
Nitrite: NEGATIVE
Protein, ur: NEGATIVE mg/dL
Specific Gravity, Urine: 1.027 (ref 1.005–1.030)
pH: 5 (ref 5.0–8.0)

## 2021-08-16 LAB — BRAIN NATRIURETIC PEPTIDE: B Natriuretic Peptide: 636.2 pg/mL — ABNORMAL HIGH (ref 0.0–100.0)

## 2021-08-16 LAB — TROPONIN I (HIGH SENSITIVITY)
Troponin I (High Sensitivity): 69 ng/L — ABNORMAL HIGH (ref <18)
Troponin I (High Sensitivity): 85 ng/L — ABNORMAL HIGH (ref <18)

## 2021-08-16 LAB — D-DIMER, QUANTITATIVE: D-Dimer, Quant: 3.24 ug/mL-FEU — ABNORMAL HIGH (ref 0.00–0.50)

## 2021-08-16 LAB — LIPASE, BLOOD: Lipase: 34 U/L (ref 11–51)

## 2021-08-16 LAB — CREATININE, SERUM
Creatinine, Ser: 0.9 mg/dL (ref 0.61–1.24)
GFR, Estimated: >60 mL/min (ref >60)

## 2021-08-16 LAB — GAMMA GT: GGT: 39 U/L (ref 7–50)

## 2021-08-16 MED ORDER — SODIUM CHLORIDE 0.9 % IV SOLN
3.0000 g | Freq: Four times a day (QID) | INTRAVENOUS | Status: DC
  Administered 2021-08-17 – 2021-08-18 (×6): 3 g via INTRAVENOUS
  Filled 2021-08-16: qty 3
  Filled 2021-08-16: qty 8
  Filled 2021-08-16: qty 3
  Filled 2021-08-16: qty 8
  Filled 2021-08-16 (×3): qty 3

## 2021-08-16 MED ORDER — SODIUM CHLORIDE 0.9 % IV SOLN
20.0000 mL/h | INTRAVENOUS | Status: DC
  Administered 2021-08-16: 20 mL/h via INTRAVENOUS

## 2021-08-16 MED ORDER — PANTOPRAZOLE 80MG IVPB - SIMPLE MED
80.0000 mg | Freq: Once | INTRAVENOUS | Status: AC
  Administered 2021-08-16: 80 mg via INTRAVENOUS
  Filled 2021-08-16: qty 100

## 2021-08-16 MED ORDER — ACETAMINOPHEN 325 MG PO TABS: 650.0000 mg | ORAL_TABLET | Freq: Four times a day (QID) | ORAL | Status: DC | PRN

## 2021-08-16 MED ORDER — HEPARIN SODIUM (PORCINE) 5000 UNIT/ML IJ SOLN
5000.0000 [IU] | Freq: Three times a day (TID) | INTRAMUSCULAR | Status: DC
  Administered 2021-08-16 – 2021-08-17 (×2): 5000 [IU] via SUBCUTANEOUS
  Filled 2021-08-16 (×3): qty 1

## 2021-08-16 MED ORDER — PANTOPRAZOLE SODIUM 40 MG IV SOLR: 40.0000 mg | Freq: Once | INTRAVENOUS | Status: DC

## 2021-08-16 MED ORDER — HYDROCODONE-ACETAMINOPHEN 5-325 MG PO TABS: 1.0000 | ORAL_TABLET | ORAL | Status: DC | PRN

## 2021-08-16 MED ORDER — ACETAMINOPHEN 650 MG RE SUPP: 650.0000 mg | Freq: Four times a day (QID) | RECTAL | Status: DC | PRN

## 2021-08-16 MED ORDER — SODIUM CHLORIDE 0.9% FLUSH
3.0000 mL | Freq: Two times a day (BID) | INTRAVENOUS | Status: DC
  Administered 2021-08-17 – 2021-08-18 (×3): 3 mL via INTRAVENOUS

## 2021-08-16 MED ORDER — IOHEXOL 350 MG/ML SOLN
75.0000 mL | Freq: Once | INTRAVENOUS | Status: AC | PRN
  Administered 2021-08-16: 75 mL via INTRAVENOUS

## 2021-08-16 MED ORDER — IOHEXOL 300 MG/ML  SOLN
100.0000 mL | Freq: Once | INTRAMUSCULAR | Status: AC | PRN
  Administered 2021-08-16: 100 mL via INTRAVENOUS
  Filled 2021-08-16: qty 100

## 2021-08-16 MED ORDER — MORPHINE SULFATE (PF) 2 MG/ML IV SOLN: 2.0000 mg | INTRAVENOUS | Status: DC | PRN

## 2021-08-16 NOTE — Progress Notes (Signed)
Pharmacy Antibiotic Note ? ?Christopher Nichols is a 36 y.o. male admitted on 08/16/2021 with PNA.  Pharmacy has been consulted for Unasyn dosing. ? ?Plan: ?Unasyn 3 gm IV Q6H ordered to start on 4/28 @ 0000.  ? ?Height: 6\' 1"  (185.4 cm) ?Weight: 119.3 kg (263 lb) ?IBW/kg (Calculated) : 79.9 ? ?Temp (24hrs), Avg:98.1 ?F (36.7 ?C), Min:97.8 ?F (36.6 ?C), Max:98.2 ?F (36.8 ?C) ? ?Recent Labs  ?Lab 08/16/21 ?1249 08/16/21 ?1957  ?WBC 6.5 4.7  ?CREATININE 1.10 0.90  ?  ?Estimated Creatinine Clearance: 152.1 mL/min (by C-G formula based on SCr of 0.9 mg/dL).   ? ?Allergies  ?Allergen Reactions  ? Clarithromycin   ?  REACTION: Stomach upset  ? ? ?Antimicrobials this admission: ?  >>  ?  >>  ? ?Dose adjustments this admission: ? ? ?Microbiology results: ? BCx:  ? UCx:   ? Sputum:   ? MRSA PCR:  ? ?Thank you for allowing pharmacy to be a part of this patient?s care. ? ?[name] D ?08/16/2021 11:02 PM ? ?

## 2021-08-16 NOTE — ED Triage Notes (Signed)
Pt to ED via POV from [facility] for RUQ abdominal pain. Pt has had pain since Monday off and on. Pt had vomiting on Monday as well. Pt started vomiting again today while he was at work. Pt does still have gallbladder. Pt denies fever or diarrhea. Pt is in NAD.  ?

## 2021-08-16 NOTE — ED Provider Notes (Signed)
? ?[facility] ?Provider Note ? ? ? Event Date/Time  ? First MD Initiated Contact with Patient 08/16/21 1422   ?  (approximate) ? ? ?History  ? ?Abdominal Pain ? ? ?HPI ? ?Darryll Raju is a 36 y.o. male with history of learning disability and HOCM presents emergency department with right upper quadrant pain.  Had vomiting today after eating.  States now he is not vomiting.  Denies fever or chills.  Has had similar symptoms previously.  Went to the urgent care and they sent him here due to the amount of tenderness in the right upper quadrant. ? ?  ? ? ?Physical Exam  ? ?Triage Vital Signs: ?ED Triage Vitals  ?Enc Vitals Group  ?   BP 08/16/21 1236 130/83  ?   Pulse Rate 08/16/21 1236 (!) 52  ?   Resp 08/16/21 1236 16  ?   Temp 08/16/21 1236 98 ?F (36.7 ?C)  ?   Temp Source 08/16/21 1236 Oral  ?   SpO2 08/16/21 1236 98 %  ?   Weight 08/16/21 1240 263 lb (119.3 kg)  ?   Height 08/16/21 1240 6\' 1"  (1.854 m)  ?   Head Circumference --   ?   Peak Flow --   ?   Pain Score 08/16/21 1240 6  ?   Pain Loc --   ?   Pain Edu? --   ?   Excl. in GC? --   ? ? ?Most recent vital signs: ?Vitals:  ? 08/16/21 1236 08/16/21 1430  ?BP: 130/83 130/80  ?Pulse: (!) 52 (!) 50  ?Resp: 16 16  ?Temp: 98 ?F (36.7 ?C)   ?SpO2: 98% 98%  ? ? ? ?General: Awake, no distress.   ?CV:  Good peripheral perfusion. regular rate and  rhythm ?Resp:  Normal effort. Lungs CTA ?Abd:  No distention.  Palpation of the right upper quadrant reproduces positive Murphy sign, bowel sounds normal all 4 quadrants ?Other:    ? ? ?ED Results / Procedures / Treatments  ? ?Labs ?(all labs ordered are listed, but only abnormal results are displayed) ?Labs Reviewed  ?COMPREHENSIVE METABOLIC PANEL - Abnormal; Notable for the following components:  ?    Result Value  ? Glucose, Bld 105 (*)   ? BUN 25 (*)   ? Total Protein 6.3 (*)   ? AST 71 (*)   ? ALT 112 (*)   ? All other components within normal limits  ?CBC - Abnormal; Notable for the following  components:  ? Hemoglobin 12.9 (*)   ? All other components within normal limits  ?URINALYSIS, ROUTINE W REFLEX MICROSCOPIC - Abnormal; Notable for the following components:  ? Color, Urine YELLOW (*)   ? APPearance CLEAR (*)   ? All other components within normal limits  ?LIPASE, BLOOD  ?GAMMA GT  ?TROPONIN I (HIGH SENSITIVITY)  ? ? ? ?EKG ? ? ? ? ?RADIOLOGY ?Ultrasound right upper quadrant ?CT abdomen/pelvis IV contrast ? ? ? ?PROCEDURES: ? ? ?Procedures ? ? ?MEDICATIONS ORDERED IN ED: ?Medications  ?pantoprazole (PROTONIX) 80 mg /NS 100 mL IVPB (has no administration in time range)  ?iohexol (OMNIPAQUE) 300 MG/ML solution 100 mL (100 mLs Intravenous Contrast Given 08/16/21 1632)  ? ? ? ?IMPRESSION / MDM / ASSESSMENT AND PLAN / ED COURSE  ?I reviewed the triage vital signs and the nursing notes. ?             ?               ? ?  Differential diagnosis includes, but is not limited to, acute cholecystitis, cholelithiasis, bowel obstruction, appendicitis, kidney stone, pyelonephritis ? ?Labs are reassuring for CBC and urinalysis, both appear to be normal, however the comprehensive metabolic panel does show a bump in his BUN of 25, AST and ALT are elevated at 71 and 112.  Concerns for acute cholecystitis due to positive Murphy sign.   awaiting the ultrasound right upper quadrant ? ?Ultrasound right upper quadrant reviewed by me.  Does show that there is gallbladder wall thickening but no gallstones.  Due to the positive Murphy sign I have concerns for acute cholecystitis.  We will order CT abdomen/pelvis with IV contrast to ensure there is no other pathology. ? ?CT abdomen/pelvis was independently reviewed by me.  Appears to have inflammation at the neck of the gallbladder and duodenum.  Confirmed by radiology, radiology has concerns of acute cholecystitis or acute duodenitis ?Dr. Erma Heritage in to see the patient ? ?Consult surgery.  Dr. Aleen Campi looked at the CT and ultrasound.  States would be very unusual to have acute  cholecystitis without gallstones and a younger healthier person.  Would like for [place] to evaluate him with HIDA scan.  Admit to surgery.  Consult with him. ? ?Consult hospitalist ? ?Due to patient's history of hocm will obtain EKG and troponins with admission. ? ?Protonix 80 mg IV was started. ? ?Hospitalist, Dr. Allena Katz will be admitting the patient.  She is aware of his history of HOCM, explained to her that I did a EKG and troponin due to that history.  Explained to her that he usually gets his cardiac care  at [facility].  He does have a defibrillator.  Explained to her that we had also discussed options with Dr. Aleen Campi and he would actually like a HIDA scan to determine whether it is truly acute cholecystitis versus duodenitis.   ? ? ? ?  ? ? ?FINAL CLINICAL IMPRESSION(S) / ED DIAGNOSES  ? ?Final diagnoses:  ?Acute duodenitis  ?Acute abdominal pain  ? ? ? ?Rx / DC Orders  ? ?ED Discharge Orders   ? ? None  ? ?  ? ? ? ?Note:  This document was prepared using Dragon voice recognition software and may include unintentional dictation errors. ? ?  ?Faythe Ghee, PA-C ?08/16/21 1824 ? ?  ?Shaune Pollack, MD ?08/17/21 2005 ? ?

## 2021-08-16 NOTE — ED Provider Notes (Signed)
?[facility] ? ? ? ?CSN: [id number] ?Arrival date & time: 08/16/21  1147 ? ? ?  ? ?History   ?Chief Complaint ?Chief Complaint  ?Patient presents with  ? Abdominal Pain  ? ? ?HPI ?Christopher Nichols is a 36 y.o. male.  Patient presents with right upper quadrant abdominal pain intermittently x 3 days.  The pain became acutely worse this morning at 10:00 and has been constant since then.  The pain is sharp, 7/10, worse with activity and improves with rest.  He reports vomiting twice today.  He denies fever, chest pain, shortness of breath, cough, diarrhea, constipation, or other symptoms.  Last bowel movement this morning.  No treatments at home.  His medical history includes GERD, cardiomyopathy, cardiac defibrillator. ? ?The history is provided by the patient and medical records.  ? ?Past Medical History:  ?Diagnosis Date  ? Cardiac defibrillator in place   ? Cardiomyopathy (HCC)   ? ? ?Patient Active Problem List  ? Diagnosis Date Noted  ? GERD 03/05/2007  ? MALAISE AND FATIGUE 03/05/2007  ? COUGH 03/05/2007  ? ? ?History reviewed. No pertinent surgical history. ? ? ? ? ?Home Medications   ? ?Prior to Admission medications   ?Medication Sig Start Date End Date Taking? Authorizing Provider  ?cephALEXin (KEFLEX) 500 MG capsule Take 1 capsule (500 mg total) by mouth 4 (four) times daily. 12/23/19   Durward Parcel, FNP  ?cetirizine (ZYRTEC) 10 MG tablet Take 10 mg by mouth daily.    [provider]  ?colchicine 0.6 MG tablet Take 1 tablet (0.6 mg total) by mouth daily. 05/10/19   Wurst, [place], PA-C  ?metoprolol (LOPRESSOR) 100 MG tablet Take 100 mg by mouth 2 (two) times daily.  08/24/15   [provider]  ?mupirocin ointment (BACTROBAN) 2 % Apply 1 application topically 2 (two) times daily. 12/23/19   Avegno, Zachery Dakins, FNP  ?naproxen sodium (ALEVE) 220 MG tablet Take 220 mg by mouth.    [provider]  ?omeprazole (PRILOSEC) 20 MG capsule Take 20 mg by mouth daily.    [provider]  ?verapamil (VERELAN) 240 MG 24 hr capsule Take 240 mg by mouth 2 (two) times daily.    [provider]  ? ? ?Family History ?Family History  ?Problem Relation Age of Onset  ? Healthy Mother   ? Healthy Father   ? ? ?Social History ?Social History  ? ?Tobacco Use  ? Smoking status: Never  ? Smokeless tobacco: Never  ?Substance Use Topics  ? Alcohol use: No  ? Drug use: No  ? ? ? ?Allergies   ?Clarithromycin ? ? ?Review of Systems ?Review of Systems  ?Constitutional:  Negative for chills and fever.  ?Respiratory:  Negative for cough and shortness of breath.   ?Cardiovascular:  Negative for chest pain and palpitations.  ?Gastrointestinal:  Positive for abdominal pain and vomiting. Negative for constipation and diarrhea.  ?Genitourinary:  Negative for dysuria and hematuria.  ?All other systems reviewed and are negative. ? ? ?Physical Exam ?Triage Vital Signs ?ED Triage Vitals [08/16/21 1149]  ?Enc Vitals Group  ?   BP   ?   Pulse   ?   Resp   ?   Temp   ?   Temp src   ?   SpO2   ?   Weight   ?   Height   ?   Head Circumference   ?   Peak Flow   ?  Pain Score 7  ?   Pain Loc   ?   Pain Edu?   ?   Excl. in GC?   ? ?No data found. ? ?Updated Vital Signs ?BP 112/65   Pulse 60   Temp 98.2 ?F (36.8 ?C)   Resp 18   SpO2 99%  ? ?Visual Acuity ?Right Eye Distance:   ?Left Eye Distance:   ?Bilateral Distance:   ? ?Right Eye Near:   ?Left Eye Near:    ?Bilateral Near:    ? ?Physical Exam ?Vitals and nursing note reviewed.  ?Constitutional:   ?   General: He is not in acute distress. ?   Appearance: Normal appearance. He is well-developed. He is not ill-appearing.  ?HENT:  ?   Mouth/Throat:  ?   Mouth: Mucous membranes are moist.  ?Cardiovascular:  ?   Rate and Rhythm: Normal rate and regular rhythm.  ?   Heart sounds: No murmur heard. ?Pulmonary:  ?   Effort: Pulmonary effort is normal. No respiratory distress.  ?   Breath sounds: Normal breath sounds.  ?Abdominal:  ?   General: Bowel sounds are  normal.  ?   Palpations: Abdomen is soft.  ?   Tenderness: There is abdominal tenderness in the right upper quadrant. There is guarding. There is no right CVA tenderness, left CVA tenderness or rebound.  ?   Comments: Acutely tender to palpation of right upper quadrant.  ?Musculoskeletal:  ?   Cervical back: Neck supple.  ?Skin: ?   General: Skin is warm and dry.  ?Neurological:  ?   Mental Status: He is alert.  ?Psychiatric:     ?   Mood and Affect: Mood normal.     ?   Behavior: Behavior normal.  ? ? ? ?UC Treatments / Results  ?Labs ?(all labs ordered are listed, but only abnormal results are displayed) ?Labs Reviewed - No data to display ? ?EKG ? ? ?Radiology ?No results found. ? ?Procedures ?Procedures (including critical care time) ? ?Medications Ordered in UC ?Medications - No data to display ? ?Initial Impression / Assessment and Plan / UC Course  ?I have reviewed the triage vital signs and the nursing notes. ? ?Pertinent labs & imaging results that were available during my care of the patient were reviewed by me and considered in my medical decision making (see chart for details). ? ?Right upper quadrant abdominal pain.  Patient has worsening abdominal pain this morning.  He is acutely tender to palpation of his right upper quadrant.  He is afebrile and vital signs are stable.  Discussed limitations of evaluation in urgent care setting.  Sending patient to the ED for evaluation.  He is accompanied by his sister and feels stable to travel POV. ? ? ?Final Clinical Impressions(s) / UC Diagnoses  ? ?Final diagnoses:  ?Right upper quadrant abdominal pain  ? ? ? ?Discharge Instructions   ? ?  ?Go to the emergency department for evaluation of your acute abdominal pain. ? ? ? ? ?ED Prescriptions   ?None ?  ? ?PDMP not reviewed this encounter. ?  ?Mickie Bail, NP ?08/16/21 1222 ? ?

## 2021-08-16 NOTE — ED Triage Notes (Signed)
Pt here with sharp RUQ pain since Monday. States it went away for a bit then came back.  ?

## 2021-08-16 NOTE — Discharge Instructions (Addendum)
Go to the emergency department for evaluation of your acute abdominal pain. 

## 2021-08-16 NOTE — Consult Note (Signed)
?Date of Consultation:  08/16/2021 ? ?Requesting Provider:  Ashok Cordia, PA-C ? ?Reason for Consultation:  RUQ abdominal pain ? ?History of Present Illness: ?Christopher Nichols is a 36 y.o. male presenting with a 4 day history of RUQ abdominal pain.  The patient reports that he started having pain on Monday 4/24 associated with nausea/emesis, then the next two days his symptoms improved, but then again today his pain and nausea resumed, associated with emesis.  Denies any fevers, chills, chest pain, shortness of breath, though he reports feeling like he could not take a good deep breath due to the discomfort in the RUQ.  Denies constipation or diarrhea.  Denies any other areas of pain except in his back.  In the ED, he had labs which showed a normal WBC, normal total bili and alk phos, but elevated AST to 71 and ALT to 112.  However, he has had labs in the past, in 2015 which also had elevated AST and ALT.  He had an U/S which had some mild gallbladder wall thickening, but otherwise no pericholecystic fluid and no gallstones.  He had a CT scan which showed some inflammation at the porta hepatis near gallbladder neck and duodenum.  He also has an enlarged heart and concern for ischemia on CT scan.  He has history of HOCM and has a defibrillator in place. ? ?Past Medical History: ?Past Medical History:  ?Diagnosis Date  ? Cardiac defibrillator in place   ? Cardiomyopathy ([place])   ?  ? ?Past Surgical History: ?--defibrillator placement, battery change. ? ?Home Medications: ?Prior to Admission medications   ?Medication Sig Start Date End Date Taking? Authorizing Provider  ?cephALEXin (KEFLEX) 500 MG capsule Take 1 capsule (500 mg total) by mouth 4 (four) times daily. 12/23/19   Emerson Monte, FNP  ?cetirizine (ZYRTEC) 10 MG tablet Take 10 mg by mouth daily.    [provider]  ?colchicine 0.6 MG tablet Take 1 tablet (0.6 mg total) by mouth daily. 05/10/19   Wurst, [place], PA-C  ?metoprolol (LOPRESSOR) 100  MG tablet Take 100 mg by mouth 2 (two) times daily.  08/24/15   [provider]  ?mupirocin ointment (BACTROBAN) 2 % Apply 1 application topically 2 (two) times daily. 12/23/19   Avegno, Darrelyn Hillock, FNP  ?naproxen sodium (ALEVE) 220 MG tablet Take 220 mg by mouth.    [provider]  ?omeprazole (PRILOSEC) 20 MG capsule Take 20 mg by mouth daily.    [provider]  ?verapamil (VERELAN) 240 MG 24 hr capsule Take 240 mg by mouth 2 (two) times daily.    [provider]  ? ? ?Allergies: ?Allergies  ?Allergen Reactions  ? Clarithromycin   ?  REACTION: Stomach upset  ? ? ?Social History: ? reports that he has never smoked. He has never used smokeless tobacco. He reports that he does not drink alcohol and does not use drugs.  ? ?Family History: ?Family History  ?Problem Relation Age of Onset  ? Healthy Mother   ? Healthy Father   ? ? ?Review of Systems: ?Review of Systems  ?Constitutional:  Negative for chills and fever.  ?HENT:  Negative for hearing loss.   ?Respiratory:  Negative for shortness of breath.   ?Cardiovascular:  Negative for chest pain.  ?Gastrointestinal:  Positive for abdominal pain, nausea and vomiting. Negative for constipation and diarrhea.  ?Genitourinary:  Negative for dysuria.  ?Musculoskeletal:  Negative for myalgias.  ?Skin:  Negative for rash.  ?Neurological:  Negative for  dizziness.  ?Psychiatric/Behavioral:  Negative for depression.   ? ?Physical Exam ?BP 130/80 (BP Location: Right Arm)   Pulse (!) 50   Temp 98 ?F (36.7 ?C) (Oral)   Resp 16   Ht _0  (1.854 m)   Wt 119.3 kg   SpO2 98%   BMI 34.70 kg/m?  ?CONSTITUTIONAL: No acute distress, well nourished. ?HEENT:  Normocephalic, atraumatic, extraocular motion intact. ?NECK: Trachea is midline, and there is no jugular venous distension. ?RESPIRATORY:  Normal respiratory effort without pathologic use of accessory muscles. ?CARDIOVASCULAR: Regular rhythm and rate. ?GI: The abdomen is soft, non-distended, with  some mild tenderness in the RUQ.  Currently negative Murphy's sign. ?MUSCULOSKELETAL:  Normal muscle strength and tone in all four extremities.  No peripheral edema or cyanosis. ?SKIN: Skin turgor is normal. There are no pathologic skin lesions.  ?NEUROLOGIC:  Motor and sensation is grossly normal.  Cranial nerves are grossly intact. ?PSYCH:  Alert and oriented to person, place and time. Affect is normal. ? ?Laboratory Analysis: ?Results for orders placed or performed during the hospital encounter of 08/16/21 (from the past 24 hour(s))  ?Lipase, blood     Status: None  ? Collection Time: 08/16/21 12:49 PM  ?Result Value Ref Range  ? Lipase 34 11 - 51 U/L  ?Comprehensive metabolic panel     Status: Abnormal  ? Collection Time: 08/16/21 12:49 PM  ?Result Value Ref Range  ? Sodium 138 135 - 145 mmol/L  ? Potassium 4.7 3.5 - 5.1 mmol/L  ? Chloride 107 98 - 111 mmol/L  ? CO2 23 22 - 32 mmol/L  ? Glucose, Bld 105 (H) 70 - 99 mg/dL  ? BUN 25 (H) 6 - 20 mg/dL  ? Creatinine, Ser 1.10 0.61 - 1.24 mg/dL  ? Calcium 9.0 8.9 - 10.3 mg/dL  ? Total Protein 6.3 (L) 6.5 - 8.1 g/dL  ? Albumin 3.9 3.5 - 5.0 g/dL  ? AST 71 (H) 15 - 41 U/L  ? ALT 112 (H) 0 - 44 U/L  ? Alkaline Phosphatase 47 38 - 126 U/L  ? Total Bilirubin 1.0 0.3 - 1.2 mg/dL  ? GFR, Estimated >60 >60 mL/min  ? Anion gap 8 5 - 15  ?CBC     Status: Abnormal  ? Collection Time: 08/16/21 12:49 PM  ?Result Value Ref Range  ? WBC 6.5 4.0 - 10.5 K/uL  ? RBC 4.38 4.22 - 5.81 MIL/uL  ? Hemoglobin 12.9 (L) 13.0 - 17.0 g/dL  ? HCT 39.8 39.0 - 52.0 %  ? MCV 90.9 80.0 - 100.0 fL  ? MCH 29.5 26.0 - 34.0 pg  ? MCHC 32.4 30.0 - 36.0 g/dL  ? RDW 13.1 11.5 - 15.5 %  ? Platelets 206 150 - 400 K/uL  ? nRBC 0.0 0.0 - 0.2 %  ?Urinalysis, Routine w reflex microscopic     Status: Abnormal  ? Collection Time: 08/16/21 12:49 PM  ?Result Value Ref Range  ? Color, Urine YELLOW (A) YELLOW  ? APPearance Nichols (A) Nichols  ? Specific Gravity, Urine 1.027 1.005 - 1.030  ? pH 5.0 5.0 - 8.0  ? Glucose, UA  NEGATIVE NEGATIVE mg/dL  ? Hgb urine dipstick NEGATIVE NEGATIVE  ? Bilirubin Urine NEGATIVE NEGATIVE  ? Ketones, ur NEGATIVE NEGATIVE mg/dL  ? Protein, ur NEGATIVE NEGATIVE mg/dL  ? Nitrite NEGATIVE NEGATIVE  ? Leukocytes,Ua NEGATIVE NEGATIVE  ? ? ?Imaging: ?CT ABDOMEN PELVIS W CONTRAST ? ?Result Date: 08/16/2021 ?CLINICAL DATA:  Abdominal pain, acute, nonlocalized. Right  upper quadrant abdominal pain. EXAM: CT ABDOMEN AND PELVIS WITH CONTRAST TECHNIQUE: Multidetector CT imaging of the abdomen and pelvis was performed using the standard protocol following bolus administration of intravenous contrast. RADIATION DOSE REDUCTION: This exam was performed according to the departmental dose-optimization program which includes automated exposure control, adjustment of the mA and/or kV according to patient size and/or use of iterative reconstruction technique. CONTRAST:  111m OMNIPAQUE IOHEXOL 300 MG/ML  SOLN COMPARISON:  12/23/2010 FINDINGS: Lower chest: Mild cardiomegaly. There is asymmetric septal hypertrophy with the intraventricular septum measuring up to 3.5 cm in thickness. There is relative thinning of the left ventricular apex since prior examination which may relate to myocardial ischemia or prior myocardial infarction in this region. Implanted cardiac defibrillator is in place with the battery pack seen within the left chest wall. The visualized lung bases are Nichols. Hepatobiliary: Moderate hepatic steatosis. The gallbladder is not distended. There is, however, trace pericholecystic fluid seen along the hepatic wall of the gallbladder. Additionally, there are subtle inflammatory changes seen in the region of the gallbladder neck which track inferiorly along the lateral wall of the second portion of the duodenum. Shotty periportal adenopathy is identified within the biliary hilum which may be reactive in nature. No intra or extrahepatic biliary ductal dilation. Pancreas: Unremarkable Spleen: Unremarkable  Adrenals/Urinary Tract: Adrenal glands are unremarkable. Kidneys are normal, without renal calculi, focal lesion, or hydronephrosis. Bladder is unremarkable. Stomach/Bowel: Stomach is within normal limits. Appendix a

## 2021-08-16 NOTE — H&P (Signed)
?History and Physical  ? ? ?Patient: Christopher Nichols:[id number] DOB: 06/12/1985 ?DOA: 08/16/2021 ?DOS: the patient was seen and examined on 08/17/2021 ?PCP: Gareth MorganKnowlton, Steve, MD  ?Patient coming from: Home ? ?Chief Complaint:  ?Chief Complaint  ?Patient presents with  ? Abdominal Pain  ? ?HPI: Christopher HesselbachChristopher Volante is a 36 y.o. male with medical history significant of HOCM and AICD [facility]  ? ?Review of Systems: Review of Systems  ?Gastrointestinal:  Positive for abdominal pain and vomiting.  ?All other systems reviewed and are negative. ? ?Duration:since Monday  ? ?Frequency:intermittent  ? ?Location:rt flank/ RUQ. ? ?Quality:sharp  ? ?Rate:10/10  ?5/10 currently.  ? ?Radiation:back to front.  ? ?Aggravating:moving.  ? ?Alleviating:resting  ? ?Associated factors:[place]/ vomiting. ? ? ?Past Medical History:  ?Diagnosis Date  ? Cardiac defibrillator in place   ? Cardiomyopathy (HCC)   ? ?History reviewed. No pertinent surgical history. ?Social History:  reports that he has never smoked. He has never used smokeless tobacco. He reports that he does not drink alcohol and does not use drugs. ? ?Allergies  ?Allergen Reactions  ? Clarithromycin   ?  REACTION: Stomach upset  ? ? ?Family History  ?Problem Relation Age of Onset  ? Healthy Mother   ? Healthy Father   ? ? ?Prior to Admission medications   ?Medication Sig Start Date End Date Taking? Authorizing Provider  ?cephALEXin (KEFLEX) 500 MG capsule Take 1 capsule (500 mg total) by mouth 4 (four) times daily. 12/23/19   Durward ParcelAvegno, Komlanvi S, FNP  ?cetirizine (ZYRTEC) 10 MG tablet Take 10 mg by mouth daily.    [provider]  ?colchicine 0.6 MG tablet Take 1 tablet (0.6 mg total) by mouth daily. 05/10/19   Wurst, [place]itt[place], PA-C  ?metoprolol (LOPRESSOR) 100 MG tablet Take 100 mg by mouth 2 (two) times daily.  08/24/15   [provider]  ?mupirocin ointment (BACTROBAN) 2 % Apply 1 application topically 2 (two) times daily. 12/23/19   Avegno, Zachery DakinsKomlanvi S,  FNP  ?naproxen sodium (ALEVE) 220 MG tablet Take 220 mg by mouth.    [provider]  ?omeprazole (PRILOSEC) 20 MG capsule Take 20 mg by mouth daily.    [provider]  ?verapamil (VERELAN) 240 MG 24 hr capsule Take 240 mg by mouth 2 (two) times daily.    [provider]  ? ? ?Physical Exam: ?Vitals:  ? 08/16/21 1240 08/16/21 1430 08/16/21 1843 08/16/21 2103  ?BP:  130/80 (!) 133/95 139/89  ?Pulse:  (!) 50 69 (!) 57  ?Resp:  16 18 20   ?Temp:   98.2 ?F (36.8 ?C) 97.8 ?F (36.6 ?C)  ?TempSrc:   Oral   ?SpO2:  98% 99% 99%  ?Weight: 119.3 kg     ?Height: 6\' 1"  (1.854 m)     ?Physical Exam ?Vitals and nursing note reviewed.  ?Constitutional:   ?   General: He is not in acute distress. ?   Appearance: Normal appearance. He is not ill-appearing, toxic-appearing or diaphoretic.  ?HENT:  ?   Head: Normocephalic and atraumatic.  ?   Right Ear: Hearing and external ear normal.  ?   Left Ear: Hearing and external ear normal.  ?   Nose: Nose normal. No nasal deformity.  ?   Mouth/Throat:  ?   Lips: Pink.  ?   Mouth: Mucous membranes are moist.  ?   Tongue: No lesions.  ?   Pharynx: Oropharynx is clear.  ?Eyes:  ?   Extraocular Movements:  Extraocular movements intact.  ?   Pupils: Pupils are equal, round, and reactive to light.  ?Neck:  ?   Vascular: No carotid bruit.  ?Cardiovascular:  ?   Rate and Rhythm: Normal rate and regular rhythm.  ?   Pulses: Normal pulses.  ?   Heart sounds: Murmur heard.  ?Pulmonary:  ?   Effort: Pulmonary effort is normal.  ?   Breath sounds: Normal breath sounds.  ?Abdominal:  ?   General: Bowel sounds are normal. There is no distension.  ?   Palpations: Abdomen is soft. There is no mass.  ?   Tenderness: There is no abdominal tenderness. There is no guarding.  ?   Hernia: No hernia is present.  ? ? ?Musculoskeletal:  ?     Back: ? ?   Right lower leg: No edema.  ?   Left lower leg: No edema.  ?Skin: ?   General: Skin is warm.  ?Neurological:  ?   General: No focal deficit  present.  ?   Mental Status: He is alert and oriented to person, place, and time.  ?   Cranial Nerves: Cranial nerves 2-12 are intact.  ?   Motor: Motor function is intact.  ?Psychiatric:     ?   Attention and Perception: Attention normal.     ?   Mood and Affect: Mood normal.     ?   Speech: Speech normal.     ?   Behavior: Behavior normal. Behavior is cooperative.     ?   Cognition and Memory: Cognition normal.  ? ? ?Data Reviewed: ?Results for orders placed or performed during the hospital encounter of 08/16/21 (from the past 24 hour(s))  ?Lipase, blood     Status: None  ? Collection Time: 08/16/21 12:49 PM  ?Result Value Ref Range  ? Lipase 34 11 - 51 U/L  ?Comprehensive metabolic panel     Status: Abnormal  ? Collection Time: 08/16/21 12:49 PM  ?Result Value Ref Range  ? Sodium 138 135 - 145 mmol/L  ? Potassium 4.7 3.5 - 5.1 mmol/L  ? Chloride 107 98 - 111 mmol/L  ? CO2 23 22 - 32 mmol/L  ? Glucose, Bld 105 (H) 70 - 99 mg/dL  ? BUN 25 (H) 6 - 20 mg/dL  ? Creatinine, Ser 1.10 0.61 - 1.24 mg/dL  ? Calcium 9.0 8.9 - 10.3 mg/dL  ? Total Protein 6.3 (L) 6.5 - 8.1 g/dL  ? Albumin 3.9 3.5 - 5.0 g/dL  ? AST 71 (H) 15 - 41 U/L  ? ALT 112 (H) 0 - 44 U/L  ? Alkaline Phosphatase 47 38 - 126 U/L  ? Total Bilirubin 1.0 0.3 - 1.2 mg/dL  ? GFR, Estimated >60 >60 mL/min  ? Anion gap 8 5 - 15  ?CBC     Status: Abnormal  ? Collection Time: 08/16/21 12:49 PM  ?Result Value Ref Range  ? WBC 6.5 4.0 - 10.5 K/uL  ? RBC 4.38 4.22 - 5.81 MIL/uL  ? Hemoglobin 12.9 (L) 13.0 - 17.0 g/dL  ? HCT 39.8 39.0 - 52.0 %  ? MCV 90.9 80.0 - 100.0 fL  ? MCH 29.5 26.0 - 34.0 pg  ? MCHC 32.4 30.0 - 36.0 g/dL  ? RDW 13.1 11.5 - 15.5 %  ? Platelets 206 150 - 400 K/uL  ? nRBC 0.0 0.0 - 0.2 %  ?Urinalysis, Routine w reflex microscopic     Status: Abnormal  ? Collection Time: 08/16/21  12:49 PM  ?Result Value Ref Range  ? Color, Urine YELLOW (A) YELLOW  ? APPearance CLEAR (A) CLEAR  ? Specific Gravity, Urine 1.027 1.005 - 1.030  ? pH 5.0 5.0 - 8.0  ?  Glucose, UA NEGATIVE NEGATIVE mg/dL  ? Hgb urine dipstick NEGATIVE NEGATIVE  ? Bilirubin Urine NEGATIVE NEGATIVE  ? Ketones, ur NEGATIVE NEGATIVE mg/dL  ? Protein, ur NEGATIVE NEGATIVE mg/dL  ? Nitrite NEGATIVE NEGATIVE  ? Leukocytes,Ua NEGATIVE NEGATIVE  ?Troponin I (High Sensitivity)     Status: Abnormal  ? Collection Time: 08/16/21 12:49 PM  ?Result Value Ref Range  ? Troponin I (High Sensitivity) 69 (H) <18 ng/L  ?Gamma GT     Status: None  ? Collection Time: 08/16/21  7:57 PM  ?Result Value Ref Range  ? GGT 39 7 - 50 U/L  ?Troponin I (High Sensitivity)     Status: Abnormal  ? Collection Time: 08/16/21  7:57 PM  ?Result Value Ref Range  ? Troponin I (High Sensitivity) 85 (H) <18 ng/L  ?D-dimer, quantitative     Status: Abnormal  ? Collection Time: 08/16/21  7:57 PM  ?Result Value Ref Range  ? D-Dimer, Quant 3.24 (H) 0.00 - 0.50 ug/mL-FEU  ?CBC     Status: Abnormal  ? Collection Time: 08/16/21  7:57 PM  ?Result Value Ref Range  ? WBC 4.7 4.0 - 10.5 K/uL  ? RBC 4.28 4.22 - 5.81 MIL/uL  ? Hemoglobin 12.7 (L) 13.0 - 17.0 g/dL  ? HCT 38.6 (L) 39.0 - 52.0 %  ? MCV 90.2 80.0 - 100.0 fL  ? MCH 29.7 26.0 - 34.0 pg  ? MCHC 32.9 30.0 - 36.0 g/dL  ? RDW 13.1 11.5 - 15.5 %  ? Platelets 191 150 - 400 K/uL  ? nRBC 0.0 0.0 - 0.2 %  ?Creatinine, serum     Status: None  ? Collection Time: 08/16/21  7:57 PM  ?Result Value Ref Range  ? Creatinine, Ser 0.90 0.61 - 1.24 mg/dL  ? GFR, Estimated >60 >60 mL/min  ?HIV Antibody (routine testing w rflx)     Status: None  ? Collection Time: 08/16/21  7:57 PM  ?Result Value Ref Range  ? HIV Screen 4th Generation wRfx Non Reactive Non Reactive  ?Hemoglobin A1c     Status: None  ? Collection Time: 08/16/21  7:57 PM  ?Result Value Ref Range  ? Hgb A1c MFr Bld 5.0 4.8 - 5.6 %  ? Mean Plasma Glucose 96.8 mg/dL  ?Reticulocytes     Status: Abnormal  ? Collection Time: 08/16/21  7:57 PM  ?Result Value Ref Range  ? Retic Ct Pct 2.7 0.4 - 3.1 %  ? RBC. 4.25 4.22 - 5.81 MIL/uL  ? Retic Count, Absolute  115.6 19.0 - 186.0 K/uL  ? Immature Retic Fract 22.3 (H) 2.3 - 15.9 %  ?Brain natriuretic peptide     Status: Abnormal  ? Collection Time: 08/16/21  7:57 PM  ?Result Value Ref Range  ? B Natriuretic Peptid

## 2021-08-17 ENCOUNTER — Observation Stay: Payer: Medicare Other

## 2021-08-17 ENCOUNTER — Observation Stay (HOSPITAL_COMMUNITY)
Admit: 2021-08-17 | Discharge: 2021-08-17 | Disposition: A | Discharge status: Home / Self Care | Payer: Medicare Other | Attending: Internal Medicine | Admitting: Internal Medicine

## 2021-08-17 DIAGNOSIS — R778 Other specified abnormalities of plasma proteins: Secondary | ICD-10-CM | POA: Diagnosis present

## 2021-08-17 DIAGNOSIS — K219 Gastro-esophageal reflux disease without esophagitis: Secondary | ICD-10-CM | POA: Diagnosis present

## 2021-08-17 DIAGNOSIS — R109 Unspecified abdominal pain: Secondary | ICD-10-CM | POA: Diagnosis present

## 2021-08-17 DIAGNOSIS — R1011 Right upper quadrant pain: Secondary | ICD-10-CM | POA: Diagnosis not present

## 2021-08-17 DIAGNOSIS — Z95810 Presence of automatic (implantable) cardiac defibrillator: Secondary | ICD-10-CM | POA: Diagnosis not present

## 2021-08-17 DIAGNOSIS — K8020 Calculus of gallbladder without cholecystitis without obstruction: Secondary | ICD-10-CM

## 2021-08-17 DIAGNOSIS — I421 Obstructive hypertrophic cardiomyopathy: Secondary | ICD-10-CM

## 2021-08-17 DIAGNOSIS — Z881 Allergy status to other antibiotic agents status: Secondary | ICD-10-CM | POA: Diagnosis not present

## 2021-08-17 DIAGNOSIS — Z79899 Other long term (current) drug therapy: Secondary | ICD-10-CM | POA: Diagnosis not present

## 2021-08-17 DIAGNOSIS — R001 Bradycardia, unspecified: Secondary | ICD-10-CM | POA: Diagnosis present

## 2021-08-17 DIAGNOSIS — R9389 Abnormal findings on diagnostic imaging of other specified body structures: Secondary | ICD-10-CM | POA: Diagnosis present

## 2021-08-17 DIAGNOSIS — K760 Fatty (change of) liver, not elsewhere classified: Secondary | ICD-10-CM | POA: Diagnosis present

## 2021-08-17 DIAGNOSIS — K2980 Duodenitis without bleeding: Secondary | ICD-10-CM | POA: Diagnosis present

## 2021-08-17 LAB — COMPREHENSIVE METABOLIC PANEL
ALT: 108 U/L — ABNORMAL HIGH (ref 0–44)
AST: 61 U/L — ABNORMAL HIGH (ref 15–41)
Albumin: 3.8 g/dL (ref 3.5–5.0)
Alkaline Phosphatase: 48 U/L (ref 38–126)
Anion gap: 7 (ref 5–15)
BUN: 21 mg/dL — ABNORMAL HIGH (ref 6–20)
CO2: 24 mmol/L (ref 22–32)
Calcium: 8.8 mg/dL — ABNORMAL LOW (ref 8.9–10.3)
Chloride: 108 mmol/L (ref 98–111)
Creatinine, Ser: 1.1 mg/dL (ref 0.61–1.24)
GFR, Estimated: >60 mL/min (ref >60)
Glucose, Bld: 89 mg/dL (ref 70–99)
Potassium: 3.8 mmol/L (ref 3.5–5.1)
Sodium: 139 mmol/L (ref 135–145)
Total Bilirubin: 1.1 mg/dL (ref 0.3–1.2)
Total Protein: 6.1 g/dL — ABNORMAL LOW (ref 6.5–8.1)

## 2021-08-17 LAB — ECHOCARDIOGRAM COMPLETE
Area-P 1/2: 3.23 cm2
Calc EF: 72.9 %
Height: 73 in
S' Lateral: 2.5 cm
Single Plane A2C EF: 66.9 %
Single Plane A4C EF: 78.6 %
Weight: 4208 oz

## 2021-08-17 LAB — CBC
HCT: 38.5 % — ABNORMAL LOW (ref 39.0–52.0)
Hemoglobin: 12.7 g/dL — ABNORMAL LOW (ref 13.0–17.0)
MCH: 30 pg (ref 26.0–34.0)
MCHC: 33 g/dL (ref 30.0–36.0)
MCV: 91 fL (ref 80.0–100.0)
Platelets: 182 10*3/uL (ref 150–400)
RBC: 4.23 MIL/uL (ref 4.22–5.81)
RDW: 13 % (ref 11.5–15.5)
WBC: 5 10*3/uL (ref 4.0–10.5)
nRBC: 0 % (ref 0.0–0.2)

## 2021-08-17 LAB — HIV ANTIBODY (ROUTINE TESTING W REFLEX): HIV Screen 4th Generation wRfx: NONREACTIVE

## 2021-08-17 LAB — PROCALCITONIN: Procalcitonin: <0.1 ng/mL

## 2021-08-17 MED ORDER — TECHNETIUM TC 99M MEBROFENIN IV KIT
5.0000 | PACK | Freq: Once | INTRAVENOUS | Status: AC
  Administered 2021-08-17: 5.41 via INTRAVENOUS

## 2021-08-17 MED ORDER — VERAPAMIL HCL ER 240 MG PO TBCR
240.0000 mg | EXTENDED_RELEASE_TABLET | Freq: Two times a day (BID) | ORAL | Status: DC
  Administered 2021-08-17 (×3): 240 mg via ORAL
  Filled 2021-08-17 (×4): qty 1

## 2021-08-17 MED ORDER — METOPROLOL TARTRATE 50 MG PO TABS
100.0000 mg | ORAL_TABLET | Freq: Two times a day (BID) | ORAL | Status: DC
  Administered 2021-08-17 (×2): 100 mg via ORAL
  Filled 2021-08-17 (×4): qty 2

## 2021-08-17 NOTE — Progress Notes (Signed)
?PROGRESS NOTE ? ?Christopher Nichols    DOB: [birth year], 36 y.o.  ?ZOX:[id number]  ?  Code Status: Full Code   ?DOA: 08/16/2021   LOS: 0  ? ?Brief hospital course  ?Christopher Nichols is a 36 y.o. male with a PMH significant for HOCM with AICD, GERD. ? ?They presented from home to the ED on 08/16/2021 with abdominal pain and nausea/vomiting x 4 days.  ? ?In the ED, it was found that they had inflammatory changes of duodenum and neck of gallbladder on abdominal CT. Vital signs were overall stable with exception of mild bradycardia of 50s. Labs significant for mild increase in AST/ALT 61/108. Lipase normal, normal GGT.  ?Of note, patient had troponin elevated to 69, and 85  with history of HOCM and AICD.  ? ?They were treated with IV Abx, IV fluids, NPO status. General surgery was consulted and recommended HIDA scan.  ?Patient was admitted to medicine service for further workup and management of abdominal pain as outlined in detail below. ? ?08/17/21 -stable, improved ? ?Assessment & Plan  ?Principal Problem: ?  Abdominal pain ?Active Problems: ?  GERD ?  HOCM (hypertrophic obstructive cardiomyopathy) (HCC) ?  Abnormal CT of the chest ? ?Abdominal pain- likely duodenitis  ?-  general surgery following, appreciate recs ? - HIDA scan today ?- advance diet as tolerated ?- consider GI consult if not improving ?- CMP am ? ?HOCM-  ?- cardiology consulted for cards clearance for potential procedure as well as elevated troponins.   ? ?Body mass index is 34.7 kg/m?. ? ?VTE ppx: heparin injection 5,000 Units Start: 08/16/21 2200 ? ? ?Diet:  ?   ?Diet  ? Diet NPO time specified Except for: Sips with Meds  ? ?Consultants: ?General surgery ?Cardiology  ?Subjective 08/17/21   ? ?Pt reports feeling improved. He is requesting to eat.  ?  ?Objective  ? ?Vitals:  ? 08/16/21 1843 08/16/21 2103 08/17/21 0430 08/17/21 0500  ?BP: (!) 133/95 139/89    ?Pulse: 69 (!) 57 70   ?Resp: 18 20    ?Temp: 98.2 ?F (36.8 ?C) 97.8 ?F (36.6 ?C)     ?TempSrc: Oral     ?SpO2: 99% 99%    ?Weight:    119.3 kg  ?Height:      ? ? ?Intake/Output Summary (Last 24 hours) at 08/17/2021 0750 ?Last data filed at 08/17/2021 0400 ?Gross per 24 hour  ?Intake 300 ml  ?Output 0 ml  ?Net 300 ml  ? ?Filed Weights  ? 08/16/21 1240 08/17/21 0500  ?Weight: 119.3 kg 119.3 kg  ?  ? ?Physical Exam:  ?General: awake, alert, NAD ?Respiratory: normal respiratory effort. ?Cardiovascular: quick capillary refill, normal S1/S2, RRR, no JVD, murmurs ?Gastrointestinal: soft, NT, ND ?Nervous: A&O x3. no gross focal neurologic deficits, normal speech ?Extremities: moves all equally, no edema, normal tone ?Skin: dry, intact, normal temperature, normal color. No rashes, lesions or ulcers on exposed skin ?Psychiatry: normal mood, congruent affect ? ?Labs   ?I have personally reviewed the following labs and imaging studies ?CBC ?   ?Component Value Date/Time  ? WBC 5.0 08/17/2021 0321  ? RBC 4.23 08/17/2021 0321  ? HGB 12.7 (L) 08/17/2021 0321  ? HCT 38.5 (L) 08/17/2021 0321  ? PLT 182 08/17/2021 0321  ? MCV 91.0 08/17/2021 0321  ? MCH 30.0 08/17/2021 0321  ? MCHC 33.0 08/17/2021 0321  ? RDW 13.0 08/17/2021 0321  ? LYMPHSABS 1.8 03/05/2007 1845  ? MONOABS 0.5 03/05/2007 1845  ? EOSABS 0.1 (  L) 03/05/2007 1845  ? BASOSABS 0.0 03/05/2007 1845  ? ? ?  Latest Ref Rng & Units 08/17/2021  ?  3:21 AM 08/16/2021  ?  7:57 PM 08/16/2021  ? 12:49 PM  ?BMP  ?Glucose 70 - 99 mg/dL 89    341    ?BUN 6 - 20 mg/dL 21    25    ?Creatinine 0.61 - 1.24 mg/dL 9.62   2.29   7.98    ?Sodium 135 - 145 mmol/L 139    138    ?Potassium 3.5 - 5.1 mmol/L 3.8    4.7    ?Chloride 98 - 111 mmol/L 108    107    ?CO2 22 - 32 mmol/L 24    23    ?Calcium 8.9 - 10.3 mg/dL 8.8    9.0    ? ? ?CT Angio Chest Pulmonary Embolism (PE) W or WO Contrast ? ?Result Date: 08/16/2021 ?CLINICAL DATA:  Pulmonary embolism suspected, high probability. EXAM: CT ANGIOGRAPHY CHEST WITH CONTRAST TECHNIQUE: Multidetector CT imaging of the chest was performed  using the standard protocol during bolus administration of intravenous contrast. Multiplanar CT image reconstructions and MIPs were obtained to evaluate the vascular anatomy. RADIATION DOSE REDUCTION: This exam was performed according to the departmental dose-optimization program which includes automated exposure control, adjustment of the mA and/or kV according to patient size and/or use of iterative reconstruction technique. CONTRAST:  97mL OMNIPAQUE IOHEXOL 350 MG/ML SOLN COMPARISON:  Two-view chest x-ray 02/18/2017. CT of the chest 12/23/2010 FINDINGS: Cardiovascular: The heart is enlarged. Aorta and great vessels are within normal limits. Pulmonary artery opacification is excellent. No focal filling defects are present to suggest pulmonary embolus. Mediastinum/Nodes: No enlarged mediastinal, hilar, or axillary lymph nodes. Thyroid gland, trachea, and esophagus demonstrate no significant findings. Lungs/Pleura: Patchy areas of ground-glass attenuation are present in both lungs. No significant consolidation is present. No nodule or mass lesion is present. No significant pleural disease is present. Upper Abdomen: Visualized upper abdomen is within normal limits. Musculoskeletal: No chest wall abnormality. No acute or significant osseous findings. Review of the MIP images confirms the above findings. IMPRESSION: 1. No pulmonary embolus. 2. Cardiomegaly without failure. 3. Patchy areas of ground-glass attenuation in both lungs compatible with edema or atypical infection. Electronically Signed   By: Marin Roberts M.D.   On: 08/16/2021 21:56  ? ?CT ABDOMEN PELVIS W CONTRAST ? ?Result Date: 08/16/2021 ?CLINICAL DATA:  Abdominal pain, acute, nonlocalized. Right upper quadrant abdominal pain. EXAM: CT ABDOMEN AND PELVIS WITH CONTRAST TECHNIQUE: Multidetector CT imaging of the abdomen and pelvis was performed using the standard protocol following bolus administration of intravenous contrast. RADIATION DOSE  REDUCTION: This exam was performed according to the departmental dose-optimization program which includes automated exposure control, adjustment of the mA and/or kV according to patient size and/or use of iterative reconstruction technique. CONTRAST:  OMNIPAQUE IOHEXOL 300 MG/ML  SOLN COMPARISON:  12/23/2010 FINDINGS: Lower chest: Mild cardiomegaly. There is asymmetric septal hypertrophy with the intraventricular septum measuring up to 3.5 cm in thickness. There is relative thinning of the left ventricular apex since prior examination which may relate to myocardial ischemia or prior myocardial infarction in this region. Implanted cardiac defibrillator is in place with the battery pack seen within the left chest wall. The visualized lung bases are clear. Hepatobiliary: Moderate hepatic steatosis. The gallbladder is not distended. There is, however, trace pericholecystic fluid seen along the hepatic wall of the gallbladder. Additionally, there are subtle inflammatory changes seen  in the region of the gallbladder neck which track inferiorly along the lateral wall of the second portion of the duodenum. Shotty periportal adenopathy is identified within the biliary hilum which may be reactive in nature. No intra or extrahepatic biliary ductal dilation. Pancreas: Unremarkable Spleen: Unremarkable Adrenals/Urinary Tract: Adrenal glands are unremarkable. Kidneys are normal, without renal calculi, focal lesion, or hydronephrosis. Bladder is unremarkable. Stomach/Bowel: Stomach is within normal limits. Appendix appears normal. No evidence of bowel wall thickening, distention, or inflammatory changes. Trace ascites within the pelvis. Vascular/Lymphatic: Circumaortic left renal vein. The abdominal vasculature is otherwise unremarkable. No pathologic adenopathy within the abdomen and pelvis. Reproductive: Prostate is unremarkable. Other: Small fat containing left and tiny fat containing right inguinal hernias are present.  Musculoskeletal: No acute bone abnormality. No lytic or blastic bone lesion. IMPRESSION: Mild inflammatory changes noted within the porta hepatis surrounding the neck of the gallbladder and tracking inferiorly along th

## 2021-08-17 NOTE — Consult Note (Signed)
?Cardiology Consultation:  ? ?Patient ID: Christopher Nichols ?MRN: [id number]; DOB: [birth year] ? ?Admit date: 08/16/2021 ?Date of Consult: 08/17/2021 ? ?PCP:  Gareth Morgan, MD ?  ?[facility] Providers ?Cardiology: [facility] cardiology ? ?Patient Profile:  ? ?Christopher Nichols is a 36 y.o. 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 Dareen Piano. ? ?History of Present Illness:  ? ?Mr. Kalas has been followed by cardiology at [facility] for the above cardiac issues.  He has a history of hypertrophic cardiomyopathy with ICD implantation for 2015.  He was initially seen as a new patient 2012 after being admitted with chest pain.  His work-up at that time included MRI and TTE which showed findings of severe concentric LVH with questionable infiltration.  He was admitted briefly again in 2014 with chest pain and treated with IV fluids initially, was eventually found to have pericarditis that was managed with NSAIDs.  He followed up with EP in 2015 and underwent subcutaneous ICD placement for 2015 given LVH and presyncopal symptoms.  He was admitted again 08/2013 with chest pain and elevated troponins.  This was felt to be demand ischemia related to severe LVH, dehydration, medical noncompliance and pericarditis.  He had cardiopulmonary testing in 12/2015 and was unable to reach target exercise capacity, deemed not impaired enough to pursue heart transplant work-up or referral to to heart failure clinic. He has severe hypertrophic (3 cm septum) with dynamic LVOT obstruction.  His father had hokum as well.  Patient was deemed not a good candidate for surgical myectomy or EtOH septal ablation due to diffuse concentric hypertrophy. ? ?PTA he was on metoprolol 100 mg twice daily and verapamil 240 mg twice daily.  ? ?The patient was admitted 4/27 for RUQ pain, found to have elevated AST/ALT, being  evaluated for cholecystitis and duodenitis. He is NPO on IVF, IV abx and pain control meds. had an U/S which had some mild gallbladder wall thickening, but otherwise no pericholecystic fluid and no gallstones.  He had a CT scan which showed some inflammation at the porta hepatis near gallbladder neck and duodenum. Plan for HIDA today. Cardiology was asked to see for cardiac evaluation prior to possible surgery.   ? ? ?Past Medical History:  ?Diagnosis Date  ? Cardiac defibrillator in place   ? Cardiomyopathy [facility])   ? ? ?History reviewed. No pertinent surgical history.  ? ?Home Medications:  ?Prior to Admission medications   ?Medication Sig Start Date End Date Taking? Authorizing Provider  ?metoprolol (LOPRESSOR) 100 MG tablet Take 100 mg by mouth 2 (two) times daily.  08/24/15  Yes [provider]  ?verapamil (VERELAN PM) 240 MG 24 hr capsule Take 240 mg by mouth 2 (two) times daily.   Yes [provider]  ? ? ?Inpatient Medications: ?Scheduled Meds: ? heparin  5,000 Units Subcutaneous Q8H  ? metoprolol tartrate  100 mg Oral BID  ? sodium chloride flush  3 mL Intravenous Q12H  ? technetium TC 77M mebrofenin  5 millicurie Intravenous Once  ? verapamil  240 mg Oral BID  ? ?Continuous Infusions: ? sodium chloride 20 mL/hr (08/16/21 1954)  ? ampicillin-sulbactam (UNASYN) IV 3 g (08/17/21 0612)  ? ?PRN Meds: ?acetaminophen **OR** acetaminophen, HYDROcodone-acetaminophen, morphine injection ? ?Allergies:    ?Allergies  ?Allergen Reactions  ? Clarithromycin   ?  REACTION: Stomach upset  ? ? ?Social History:   ?  Social History  ? ?Socioeconomic History  ? Marital status: Married  ?  Spouse name: Not on file  ? Number of children: Not on file  ? Years of education: Not on file  ? Highest education level: Not on file  ?Occupational History  ? Not on file  ?Tobacco Use  ? Smoking status: Never  ? Smokeless tobacco: Never  ?Substance and Sexual Activity  ? Alcohol use: No  ? Drug use: No  ? Sexual activity: Not  on file  ?Other Topics Concern  ? Not on file  ?Social History Narrative  ? Not on file  ? ?Social Determinants of Health  ? ?Financial Resource Strain: Not on file  ?Food Insecurity: Not on file  ?Transportation Needs: Not on file  ?Physical Activity: Not on file  ?Stress: Not on file  ?Social Connections: Not on file  ?Intimate Partner Violence: Not on file  ?  ?Family History:   ? ?Family History  ?Problem Relation Age of Onset  ? Healthy Mother   ? Healthy Father   ?  ? ?ROS:  ?Please see the history of present illness.  ? ?All other ROS reviewed and negative.    ? ?Physical Exam/Data:  ? ?Vitals:  ? 08/16/21 2103 08/17/21 0430 08/17/21 0500 08/17/21 [id number]  ?BP: 139/89   112/62  ?Pulse: (!) 57 70  (!) 56  ?Resp: 20   18  ?Temp: 97.8 ?F (36.6 ?C)   97.7 ?F (36.5 ?C)  ?TempSrc:      ?SpO2: 99%   96%  ?Weight:   119.3 kg   ?Height:      ? ? ?Intake/Output Summary (Last 24 hours) at 08/17/2021 1136 ?Last data filed at 08/17/2021 0400 ?Gross per 24 hour  ?Intake 300 ml  ?Output 0 ml  ?Net 300 ml  ? ? ?  08/17/2021  ?  5:00 AM 08/16/2021  ? 12:40 PM 02/14/2016  ?  6:57 PM  ?Last 3 Weights  ?Weight (lbs) 263 lb 263 lb 230 lb  ?Weight (kg) 119.296 kg 119.296 kg 104.327 kg  ?   ?Body mass index is 34.7 kg/m?.  ?General:  Well nourished, well developed, in no acute distress ?HEENT: normal ?Neck: no JVD ?Vascular: No carotid bruits; Distal pulses 2+ bilaterally ?Cardiac:  normal S1, S2; RRR; + murmur  ?Lungs:  clear to auscultation bilaterally, no wheezing, rhonchi or rales  ?Abd: soft, nontender, no hepatomegaly  ?Ext: no edema ?Musculoskeletal:  No deformities, BUE and BLE strength normal and equal ?Skin: warm and dry  ?Neuro:  CNs 2-12 intact, no focal abnormalities noted ?Psych:  Normal affect  ? ?EKG:  The EKG was personally reviewed and demonstrates:  pending ?Telemetry:  Telemetry was personally reviewed and demonstrates:  N/A ? ?Relevant CV Studies: ? ?Cardiopulmonary stress test 12/2015 ?Conclusions:  ?These results are  consistent with mild cardiopulmonary disease at  ?sub-optimal workload (RER <1.0).  ? ?Results:  ?Patient exercised on a treadmill for 9 mins 21 secs on a Modified [place]  ?protocol.  ? ?Baseline measurements:  BP = 123/68 mmHg, HR = 61 bpm  ?Peak measurements:  BP = 145/72 mmHg, HR = 94 bpm (52% PMHR).  ?Predicted Maximum HR (PMHR) = 189 bpm.  ? ?Baseline ECG: Sinus rhythm.  ?Stress ECG:  no electrocardiographic ST changes diagnostic of myocardial  ?ischemia.  ?There were no significant arrhythmias during the study.  ? ?The test was terminated because of chest pain. Patient reported chest pain  ?7/10 at end of test.  Chest pain was 1/10 by 1:50 into recovery. Chest pain  ?free after 4 mins of recovery.  ? ?Patient achieved a peak oxygen consumption (VO2) of 14.3 ml/kg/min (33% of  ?predicted peak VO2) at a respiratory exchange ratio (RER) of 0.87.  ? ?Echo 09/2014 Severe LVH (>75%), DD, elevated LVEDP, dilated atria, SAM. Septal thickness measured on prior study at 3cm.  ? ?Zio patch: Predominantly sinus rhythm without significant arrhythmias ? ?Cardiac MRI (03/05/13): Diffusely thickened LV myocardium compatible with hypertrophic cardiomyopathy. SAM and turbulent flow in the LVOT are evident. LV cavity is completely obliterated during systole. ? ?Laboratory Data: ? ?High Sensitivity Troponin:   ?Recent Labs  ?Lab 08/16/21 ?1249 08/16/21 ?1957  ?TROPONINIHS 69* 85*  ?   ?Chemistry ?Recent Labs  ?Lab 08/16/21 ?1249 08/16/21 ?1957 08/17/21 ?0321  ?NA 138  --  139  ?K 4.7  --  3.8  ?CL 107  --  108  ?CO2 23  --  24  ?GLUCOSE 105*  --  89  ?BUN 25*  --  21*  ?CREATININE 1.10 0.90 1.10  ?CALCIUM 9.0  --  8.8*  ?GFRNONAA >60 >60 >60  ?ANIONGAP 8  --  7  ?  ?Recent Labs  ?Lab 08/16/21 ?1249 08/17/21 ?0321  ?PROT 6.3* 6.1*  ?ALBUMIN 3.9 3.8  ?AST 71* 61*  ?ALT 112* 108*  ?ALKPHOS 47 48  ?BILITOT 1.0 1.1  ? ?Lipids No results for input(s): CHOL, TRIG, HDL, LABVLDL, LDLCALC, CHOLHDL in the last 168 hours.   ?Hematology ?Recent Labs  ?Lab 08/16/21 ?1249 08/16/21 ?1957 08/17/21 ?0321  ?WBC 6.5 4.7 5.0  ?RBC 4.38 4.28  4.25 4.23  ?HGB 12.9* 12.7* 12.7*  ?HCT 39.8 38.6* 38.5*  ?MCV 90.9 90.2 91.0  ?MCH 29.5 29.7 30.0  ?MCHC 32.4

## 2021-08-17 NOTE — TOC Initial Note (Signed)
Transition of Care (TOC) - Initial/Assessment Note  ? ? ?Patient Details  ?Name: Christopher Nichols ?MRN: [id number] ?Date of Birth: 03-Feb-1986 ? ?Transition of Care (TOC) CM/SW Contact:    ?Marlowe Sax, RN ?Phone Number: ?08/17/2021, 9:56 AM ? ?Clinical Narrative:                 ? ? ?Transition of Care (TOC) Screening Note ? ? ?Patient Details  ?Name: Christopher Nichols ?Date of Birth: [birth year] ? ? ?Transition of Care (TOC) CM/SW Contact:    ?Marlowe Sax, RN ?Phone Number: ?08/17/2021, 9:56 AM ? ? ? ?Transition of Care Department [facility]) has reviewed patient and no TOC needs have been identified at this time. We will continue to monitor patient advancement through interdisciplinary progression rounds. If new patient transition needs arise, please place a TOC consult. ?  ?  ?  ? ? ?Patient Goals and CMS Choice ?  ?  ?  ? ?Expected Discharge Plan and Services ?  ?  ?  ?  ?  ?                ?  ?  ?  ?  ?  ?  ?  ?  ?  ?  ? ?Prior Living Arrangements/Services ?  ?  ?  ?       ?  ?  ?  ?  ? ?Activities of Daily Living ?Home Assistive Devices/Equipment: None ?ADL Screening (condition at time of admission) ?Patient's cognitive ability adequate to safely complete daily activities?: Yes ?Is the patient deaf or have difficulty hearing?: No ?Does the patient have difficulty seeing, even when wearing glasses/contacts?: No ?Does the patient have difficulty concentrating, remembering, or making decisions?: No ?Patient able to express need for assistance with ADLs?: Yes ?Does the patient have difficulty dressing or bathing?: No ?Independently performs ADLs?: Yes (appropriate for developmental age) ?Does the patient have difficulty walking or climbing stairs?: No ?Weakness of Legs: None ?Weakness of Arms/Hands: None ? ?Permission Sought/Granted ?  ?  ?   ?   ?   ?   ? ?Emotional Assessment ?  ?  ?  ?  ?  ?  ? ?Admission diagnosis:  Acute abdominal pain [R10.9] ?Abdominal pain [R10.9] ?Acute duodenitis [K29.80] ?Patient  Active Problem List  ? Diagnosis Date Noted  ? Abnormal CT of the chest 08/17/2021  ? Abdominal pain 08/16/2021  ? ICD (implantable cardioverter-defibrillator) in place 11/01/2020  ? HOCM (hypertrophic obstructive cardiomyopathy) (HCC) 09/22/2012  ? GERD 03/05/2007  ? MALAISE AND FATIGUE 03/05/2007  ? COUGH 03/05/2007  ? ?PCP:  Gareth Morgan, MD ?Pharmacy:   ?[facility] - [place], [place] - [address] ?[address] ?[place] [address] ?Phone: [phone] Fax: [phone] ? ? ? ? ?Social Determinants of Health (SDOH) Interventions ?  ? ?Readmission Risk Interventions ?   ? View : No data to display.  ?  ?  ?  ? ? ? ?

## 2021-08-17 NOTE — Plan of Care (Signed)

## 2021-08-17 NOTE — Progress Notes (Signed)
[facility] ?SURGICAL PROGRESS NOTE (cpt [phone]) ? ?Hospital Day(s): 0.  ? ?Interval History: Patient seen and examined, no acute events or new complaints overnight. Patient reports he is doing better this morning. He denied any overt abdominal pain. No fever, chills, nausea, emesis. He remains without leukocytosis; WBC 5.0K. Renal function is normal; sCr - 1.10. No electrolyte derangements. AST?ALT elevation improved some. Bilirubin level is normal at 1.1. He is NPO this morning. HIDA is pending.  ? ?Review of Systems:  ?Constitutional: denies fever, chills  ?HEENT: denies cough or congestion  ?Respiratory: denies any shortness of breath  ?Cardiovascular: denies chest pain or palpitations  ?Gastrointestinal: denies abdominal pain, N/V ?Genitourinary: denies burning with urination or urinary frequency ?Musculoskeletal: denies pain, decreased motor or sensation ? ?Vital signs in last 24 hours: [min-max] current  ?Temp:  [97.8 ?F (36.6 ?C)-98.2 ?F (36.8 ?C)] 97.8 ?F (36.6 ?C) (04/27 2103) ?Pulse Rate:  [50-70] 70 (04/28 0430) ?Resp:  [16-20] 20 (04/27 2103) ?BP: (112-139)/(65-95) 139/89 (04/27 2103) ?SpO2:  [98 %-99 %] 99 % (04/27 2103) ?Weight:  [119.3 kg] 119.3 kg (04/28 0500)     Height: 6\' 1"  (185.4 cm) Weight: 119.3 kg BMI (Calculated): 34.71  ? ?Intake/Output last 2 shifts:  ?04/27 0701 - 04/28 0700 ?In: 300 [IV Piggyback:300] ?Out: 0   ? ?Physical Exam:  ?Constitutional: alert, cooperative and no distress  ?HENT: normocephalic without obvious abnormality  ?Eyes: PERRL, EOM's grossly intact and symmetric  ?Respiratory: breathing non-labored at rest  ?Cardiovascular: regular rate and sinus rhythm  ?Gastrointestinal: soft, mild (if any) right abdominal soreness, and non-distended. No rebound/guarding  ?Musculoskeletal: UE and LE FROM, no edema or wounds, motor and sensation grossly intact, NT  ? ? ?Labs:  ? ?  Latest Ref Rng & Units 08/17/2021  ?  3:21 AM 08/16/2021  ?  7:57 PM 08/16/2021  ? 12:49 PM   ?CBC  ?WBC 4.0 - 10.5 K/uL 5.0   4.7   6.5    ?Hemoglobin 13.0 - 17.0 g/dL 08/18/2021   10.2   58.5    ?Hematocrit 39.0 - 52.0 % 38.5   38.6   39.8    ?Platelets 150 - 400 K/uL 182   191   206    ? ? ?  Latest Ref Rng & Units 08/17/2021  ?  3:21 AM 08/16/2021  ?  7:57 PM 08/16/2021  ? 12:49 PM  ?CMP  ?Glucose 70 - 99 mg/dL 89    08/18/2021    ?BUN 6 - 20 mg/dL 21    25    ?Creatinine 0.61 - 1.24 mg/dL 824   2.35   3.61    ?Sodium 135 - 145 mmol/L 139    138    ?Potassium 3.5 - 5.1 mmol/L 3.8    4.7    ?Chloride 98 - 111 mmol/L 108    107    ?CO2 22 - 32 mmol/L 24    23    ?Calcium 8.9 - 10.3 mg/dL 8.8    9.0    ?Total Protein 6.5 - 8.1 g/dL 6.1    6.3    ?Total Bilirubin 0.3 - 1.2 mg/dL 1.1    1.0    ?Alkaline Phos 38 - 126 U/L 48    47    ?AST 15 - 41 U/L 61    71    ?ALT 0 - 44 U/L 108    112    ? ? ?Imaging studies: No new pertinent imaging studies; HIDA pending ? ? ?  Assessment/Plan: (ICD-10's: K80.20) ?36 y.o. male with cholelithiasis; work up pending to rule in/out cholecystitis; also potentially may be duodenitis, complicated by significant medical comorbidities.  ? ? - Given unclear source of abdominal pain and no definitive evidence of cholecystitis, we will proceed with HIDA today. If this is positive, we will evaluate for cholecystectomy. If negative, no indication for surgical interventions.   ? - NPO this morning + IVF Resuscitation ?- IV Abx (Unasyn)  ? - Monitor abdominal pain ?- Pain control prn (hold narcotics for HIDA); antiemetics prn  ? - Further management per primary service  ? ?All of the above findings and recommendations were discussed with the patient, patient's family, and the medical team, and all of patient's and family's questions were answered to their expressed satisfaction. ? ?-- ?Lynden Oxford, PA-C ?[facility] ?08/17/2021, 7:39 AM ?M-F: 7am - 4pm ? ?

## 2021-08-18 DIAGNOSIS — K219 Gastro-esophageal reflux disease without esophagitis: Secondary | ICD-10-CM | POA: Diagnosis not present

## 2021-08-18 DIAGNOSIS — K2980 Duodenitis without bleeding: Secondary | ICD-10-CM | POA: Diagnosis not present

## 2021-08-18 DIAGNOSIS — I421 Obstructive hypertrophic cardiomyopathy: Secondary | ICD-10-CM | POA: Diagnosis not present

## 2021-08-18 DIAGNOSIS — R109 Unspecified abdominal pain: Secondary | ICD-10-CM | POA: Diagnosis not present

## 2021-08-18 LAB — COMPREHENSIVE METABOLIC PANEL
ALT: 101 U/L — ABNORMAL HIGH (ref 0–44)
AST: 51 U/L — ABNORMAL HIGH (ref 15–41)
Albumin: 3.7 g/dL (ref 3.5–5.0)
Alkaline Phosphatase: 44 U/L (ref 38–126)
Anion gap: 8 (ref 5–15)
BUN: 17 mg/dL (ref 6–20)
CO2: 28 mmol/L (ref 22–32)
Calcium: 8.7 mg/dL — ABNORMAL LOW (ref 8.9–10.3)
Chloride: 104 mmol/L (ref 98–111)
Creatinine, Ser: 1.11 mg/dL (ref 0.61–1.24)
GFR, Estimated: >60 mL/min (ref >60)
Glucose, Bld: 100 mg/dL — ABNORMAL HIGH (ref 70–99)
Potassium: 3.8 mmol/L (ref 3.5–5.1)
Sodium: 140 mmol/L (ref 135–145)
Total Bilirubin: 0.7 mg/dL (ref 0.3–1.2)
Total Protein: 5.9 g/dL — ABNORMAL LOW (ref 6.5–8.1)

## 2021-08-18 LAB — CBC
HCT: 38 % — ABNORMAL LOW (ref 39.0–52.0)
Hemoglobin: 12.6 g/dL — ABNORMAL LOW (ref 13.0–17.0)
MCH: 30.1 pg (ref 26.0–34.0)
MCHC: 33.2 g/dL (ref 30.0–36.0)
MCV: 90.9 fL (ref 80.0–100.0)
Platelets: 175 10*3/uL (ref 150–400)
RBC: 4.18 MIL/uL — ABNORMAL LOW (ref 4.22–5.81)
RDW: 12.9 % (ref 11.5–15.5)
WBC: 3.8 10*3/uL — ABNORMAL LOW (ref 4.0–10.5)
nRBC: 0 % (ref 0.0–0.2)

## 2021-08-18 NOTE — Discharge Summary (Signed)
? ? ?Physician Discharge Summary  ?Patient: Christopher Nichols TDD:[id number] DOB: 1986/04/06   Code Status: Full Code ?Admit date: 08/16/2021 ?Discharge date: 08/18/2021 ?Disposition: Home, No home health services recommended ?PCP: Gareth Morgan, MD ? ?Recommendations for Outpatient Follow-up:  ?Follow up with PCP within 1-2 weeks ?Follow up with cardiology  ? ?Discharge Diagnoses:  ?Principal Problem: ?  Abdominal pain ?Active Problems: ?  GERD ?  HOCM (hypertrophic obstructive cardiomyopathy) (HCC) ?  Abnormal CT of the chest ?  Acute abdominal pain ?  Acute duodenitis ? ?Brief Hospital Course Summary: ?Brockton Mckesson is a 36 y.o. male with a PMH significant for HOCM with AICD, GERD. ?  ?They presented from home to the ED on 08/16/2021 with abdominal pain and nausea/vomiting x 4 days.  ?  ?In the ED, it was found that they had inflammatory changes of duodenum and neck of gallbladder on abdominal CT thought to be duodenitis vs gallbladder disease.  ? ?Vital signs were overall stable with exception of mild bradycardia of 50s.  ?Labs significant for mild increase in AST/ALT 61/108. Lipase normal, normal GGT.  ?Of note, patient had troponin elevated to 69, and 85  with history of HOCM and AICD. Cardiology was consulted for possible surgery clearance and did not recommend further ischemic workup. Echo performed this admission was relatively unremarkable.  ?  ?They were initially treated with IV Abx, IV fluids, NPO status.  ? ?General surgery was consulted and recommended HIDA scan which was negative.  ? ? Patient's symptoms improved with supportive care only. He likely had viral duodenitis and resolving spontaneously. He was able to tolerate a normal diet and had no abdominal pain on day of discharge.  ? ?All other chronic conditions were treated with home medications.  ? ?Discharge Condition: Good, improved ?Recommended discharge diet: Cardiac diet ? ?Consultations: ?Cardiology  ?General surgery   ? ?Procedures/Studies: ?HIDA scan ?Echo  ? ? ?Allergies as of 08/18/2021   ? ?   Reactions  ? Clarithromycin   ? REACTION: Stomach upset  ? ?  ? ?  ?Medication List  ?  ? ?TAKE these medications   ? ?metoprolol tartrate 100 MG tablet ?Commonly known as: LOPRESSOR ?Take 100 mg by mouth 2 (two) times daily. ?  ?verapamil 240 MG 24 hr capsule ?Commonly known as: VERELAN PM ?Take 240 mg by mouth 2 (two) times daily. ?  ? ?  ? ? ? ?Subjective   ?Pt reports feeling well. Has no more episodes of N/V/D/abdominal pain. Denies SOB or CP. He would like to go home.  ?Objective  ?Blood pressure 130/77, pulse (!) 49, temperature 98.4 ?F (36.9 ?C), temperature source Oral, resp. rate 18, height 6\' 1"  (1.854 m), weight 128.2 kg, SpO2 100 %.  ? ?General: Pt is alert, awake, not in acute distress ?Cardiovascular: RRR, S1/S2 +, no rubs, no gallops ?Respiratory: CTA bilaterally, no wheezing, no rhonchi ?Abdominal: Soft, NT, ND, bowel sounds + ?Extremities: no edema, no cyanosis ? ?The results of significant diagnostics from this hospitalization (including imaging, microbiology, ancillary and laboratory) are listed below for reference.  ? ?Imaging studies: ?CT Angio Chest Pulmonary Embolism (PE) W or WO Contrast ? ?Result Date: 08/16/2021 ?CLINICAL DATA:  Pulmonary embolism suspected, high probability. EXAM: CT ANGIOGRAPHY CHEST WITH CONTRAST TECHNIQUE: Multidetector CT imaging of the chest was performed using the standard protocol during bolus administration of intravenous contrast. Multiplanar CT image reconstructions and MIPs were obtained to evaluate the vascular anatomy. RADIATION DOSE REDUCTION: This exam was performed according to  the departmental dose-optimization program which includes automated exposure control, adjustment of the mA and/or kV according to patient size and/or use of iterative reconstruction technique. CONTRAST:  65mL OMNIPAQUE IOHEXOL 350 MG/ML SOLN COMPARISON:  Two-view chest x-ray 02/18/2017. CT of the chest  12/23/2010 FINDINGS: Cardiovascular: The heart is enlarged. Aorta and great vessels are within normal limits. Pulmonary artery opacification is excellent. No focal filling defects are present to suggest pulmonary embolus. Mediastinum/Nodes: No enlarged mediastinal, hilar, or axillary lymph nodes. Thyroid gland, trachea, and esophagus demonstrate no significant findings. Lungs/Pleura: Patchy areas of ground-glass attenuation are present in both lungs. No significant consolidation is present. No nodule or mass lesion is present. No significant pleural disease is present. Upper Abdomen: Visualized upper abdomen is within normal limits. Musculoskeletal: No chest wall abnormality. No acute or significant osseous findings. Review of the MIP images confirms the above findings. IMPRESSION: 1. No pulmonary embolus. 2. Cardiomegaly without failure. 3. Patchy areas of ground-glass attenuation in both lungs compatible with edema or atypical infection. Electronically Signed   By: Marin Roberts M.D.   On: 08/16/2021 21:56  ? ?NM Hepatobiliary Liver Func ? ?Result Date: 08/17/2021 ?CLINICAL DATA:  Right upper quadrant pain. Nondiagnostic ultrasound. Rule out cholecystitis. EXAM: NUCLEAR MEDICINE HEPATOBILIARY IMAGING TECHNIQUE: Sequential images of the abdomen were obtained out to 60 minutes following intravenous administration of radiopharmaceutical. RADIOPHARMACEUTICALS:  5.4 mCi Tc-51m  Choletec IV COMPARISON:  Abdominal sonogram 08/16/21. FINDINGS: Prompt uptake and biliary excretion of activity by the liver is seen. Gallbladder activity is visualized, consistent with patency of cystic duct. Biliary activity passes into small bowel, consistent with patent common bile duct. IMPRESSION: 1. Normal exam. Electronically Signed   By: Signa Kell M.D.   On: 08/17/2021 12:43  ? ?CT ABDOMEN PELVIS W CONTRAST ? ?Result Date: 08/16/2021 ?CLINICAL DATA:  Abdominal pain, acute, nonlocalized. Right upper quadrant abdominal pain.  EXAM: CT ABDOMEN AND PELVIS WITH CONTRAST TECHNIQUE: Multidetector CT imaging of the abdomen and pelvis was performed using the standard protocol following bolus administration of intravenous contrast. RADIATION DOSE REDUCTION: This exam was performed according to the departmental dose-optimization program which includes automated exposure control, adjustment of the mA and/or kV according to patient size and/or use of iterative reconstruction technique. CONTRAST:  OMNIPAQUE IOHEXOL 300 MG/ML  SOLN COMPARISON:  12/23/2010 FINDINGS: Lower chest: Mild cardiomegaly. There is asymmetric septal hypertrophy with the intraventricular septum measuring up to 3.5 cm in thickness. There is relative thinning of the left ventricular apex since prior examination which may relate to myocardial ischemia or prior myocardial infarction in this region. Implanted cardiac defibrillator is in place with the battery pack seen within the left chest wall. The visualized lung bases are clear. Hepatobiliary: Moderate hepatic steatosis. The gallbladder is not distended. There is, however, trace pericholecystic fluid seen along the hepatic wall of the gallbladder. Additionally, there are subtle inflammatory changes seen in the region of the gallbladder neck which track inferiorly along the lateral wall of the second portion of the duodenum. Shotty periportal adenopathy is identified within the biliary hilum which may be reactive in nature. No intra or extrahepatic biliary ductal dilation. Pancreas: Unremarkable Spleen: Unremarkable Adrenals/Urinary Tract: Adrenal glands are unremarkable. Kidneys are normal, without renal calculi, focal lesion, or hydronephrosis. Bladder is unremarkable. Stomach/Bowel: Stomach is within normal limits. Appendix appears normal. No evidence of bowel wall thickening, distention, or inflammatory changes. Trace ascites within the pelvis. Vascular/Lymphatic: Circumaortic left renal vein. The abdominal vasculature  is otherwise unremarkable. No pathologic adenopathy within  the abdomen and pelvis. Reproductive: Prostate is unremarkable. Other: Small fat containing left and tiny fat containing right inguinal hernias are present. [place]

## 2021-08-18 NOTE — Plan of Care (Signed)

## 2021-08-18 NOTE — Discharge Instructions (Addendum)
You can take an over the counter PPI such as pantoprazole as needed for acid reflux symptoms.  ?Follow up with your primary doctor if symptoms return to consider a GI referral ? ?

## 2021-09-18 ENCOUNTER — Other Ambulatory Visit: Payer: Self-pay

## 2021-09-18 ENCOUNTER — Observation Stay
Admission: EM | Admit: 2021-09-18 | Discharge: 2021-09-19 | Disposition: A | Discharge status: Home / Self Care | Payer: Medicare Other | Attending: Internal Medicine | Admitting: Internal Medicine

## 2021-09-18 ENCOUNTER — Emergency Department: Payer: Medicare Other

## 2021-09-18 DIAGNOSIS — I421 Obstructive hypertrophic cardiomyopathy: Secondary | ICD-10-CM | POA: Diagnosis not present

## 2021-09-18 DIAGNOSIS — Z6830 Body mass index (BMI) 30.0-30.9, adult: Secondary | ICD-10-CM | POA: Insufficient documentation

## 2021-09-18 DIAGNOSIS — R42 Dizziness and giddiness: Secondary | ICD-10-CM | POA: Diagnosis not present

## 2021-09-18 DIAGNOSIS — Z8679 Personal history of other diseases of the circulatory system: Secondary | ICD-10-CM | POA: Diagnosis not present

## 2021-09-18 DIAGNOSIS — I422 Other hypertrophic cardiomyopathy: Secondary | ICD-10-CM | POA: Diagnosis not present

## 2021-09-18 DIAGNOSIS — R7989 Other specified abnormal findings of blood chemistry: Secondary | ICD-10-CM | POA: Diagnosis not present

## 2021-09-18 DIAGNOSIS — I5042 Chronic combined systolic (congestive) and diastolic (congestive) heart failure: Secondary | ICD-10-CM | POA: Diagnosis not present

## 2021-09-18 DIAGNOSIS — M109 Gout, unspecified: Secondary | ICD-10-CM | POA: Diagnosis not present

## 2021-09-18 DIAGNOSIS — Z79899 Other long term (current) drug therapy: Secondary | ICD-10-CM | POA: Diagnosis not present

## 2021-09-18 DIAGNOSIS — R7401 Elevation of levels of liver transaminase levels: Secondary | ICD-10-CM | POA: Insufficient documentation

## 2021-09-18 DIAGNOSIS — I4510 Unspecified right bundle-branch block: Secondary | ICD-10-CM | POA: Diagnosis not present

## 2021-09-18 DIAGNOSIS — R61 Generalized hyperhidrosis: Secondary | ICD-10-CM | POA: Insufficient documentation

## 2021-09-18 DIAGNOSIS — R0602 Shortness of breath: Secondary | ICD-10-CM | POA: Diagnosis not present

## 2021-09-18 DIAGNOSIS — Z6837 Body mass index (BMI) 37.0-37.9, adult: Secondary | ICD-10-CM | POA: Insufficient documentation

## 2021-09-18 DIAGNOSIS — Z95810 Presence of automatic (implantable) cardiac defibrillator: Secondary | ICD-10-CM | POA: Insufficient documentation

## 2021-09-18 DIAGNOSIS — R5383 Other fatigue: Secondary | ICD-10-CM | POA: Diagnosis not present

## 2021-09-18 DIAGNOSIS — E875 Hyperkalemia: Secondary | ICD-10-CM | POA: Diagnosis not present

## 2021-09-18 DIAGNOSIS — F909 Attention-deficit hyperactivity disorder, unspecified type: Secondary | ICD-10-CM | POA: Insufficient documentation

## 2021-09-18 DIAGNOSIS — R55 Syncope and collapse: Secondary | ICD-10-CM | POA: Diagnosis present

## 2021-09-18 DIAGNOSIS — R9431 Abnormal electrocardiogram [ECG] [EKG]: Secondary | ICD-10-CM | POA: Insufficient documentation

## 2021-09-18 DIAGNOSIS — I442 Atrioventricular block, complete: Secondary | ICD-10-CM | POA: Insufficient documentation

## 2021-09-18 DIAGNOSIS — R778 Other specified abnormalities of plasma proteins: Secondary | ICD-10-CM | POA: Diagnosis not present

## 2021-09-18 DIAGNOSIS — R6250 Unspecified lack of expected normal physiological development in childhood: Secondary | ICD-10-CM | POA: Insufficient documentation

## 2021-09-18 DIAGNOSIS — R5381 Other malaise: Secondary | ICD-10-CM | POA: Insufficient documentation

## 2021-09-18 DIAGNOSIS — R011 Cardiac murmur, unspecified: Secondary | ICD-10-CM | POA: Insufficient documentation

## 2021-09-18 DIAGNOSIS — E669 Obesity, unspecified: Secondary | ICD-10-CM | POA: Diagnosis not present

## 2021-09-18 DIAGNOSIS — R531 Weakness: Secondary | ICD-10-CM | POA: Insufficient documentation

## 2021-09-18 DIAGNOSIS — R001 Bradycardia, unspecified: Secondary | ICD-10-CM | POA: Diagnosis present

## 2021-09-18 DIAGNOSIS — I5030 Unspecified diastolic (congestive) heart failure: Secondary | ICD-10-CM | POA: Insufficient documentation

## 2021-09-18 LAB — COMPREHENSIVE METABOLIC PANEL
ALT: 71 U/L — ABNORMAL HIGH (ref 0–44)
AST: 38 U/L (ref 15–41)
Albumin: 4.5 g/dL (ref 3.5–5.0)
Alkaline Phosphatase: 50 U/L (ref 38–126)
Anion gap: 6 (ref 5–15)
BUN: 28 mg/dL — ABNORMAL HIGH (ref 6–20)
CO2: 24 mmol/L (ref 22–32)
Calcium: 9.1 mg/dL (ref 8.9–10.3)
Chloride: 106 mmol/L (ref 98–111)
Creatinine, Ser: 1.2 mg/dL (ref 0.61–1.24)
GFR, Estimated: >60 mL/min (ref >60)
Glucose, Bld: 183 mg/dL — ABNORMAL HIGH (ref 70–99)
Potassium: 6.8 mmol/L (ref 3.5–5.1)
Sodium: 136 mmol/L (ref 135–145)
Total Bilirubin: 1.1 mg/dL (ref 0.3–1.2)
Total Protein: 7.2 g/dL (ref 6.5–8.1)

## 2021-09-18 LAB — PHOSPHORUS: Phosphorus: 3.7 mg/dL (ref 2.5–4.6)

## 2021-09-18 LAB — CBC WITH DIFFERENTIAL/PLATELET
Abs Immature Granulocytes: 0.04 10*3/uL (ref 0.00–0.07)
Basophils Absolute: 0.1 10*3/uL (ref 0.0–0.1)
Basophils Relative: 1 %
Eosinophils Absolute: 0.1 10*3/uL (ref 0.0–0.5)
Eosinophils Relative: 1 %
HCT: 44 % (ref 39.0–52.0)
Hemoglobin: 14.4 g/dL (ref 13.0–17.0)
Immature Granulocytes: 0 %
Lymphocytes Relative: 27 %
Lymphs Abs: 2.6 10*3/uL (ref 0.7–4.0)
MCH: 30.1 pg (ref 26.0–34.0)
MCHC: 32.7 g/dL (ref 30.0–36.0)
MCV: 92.1 fL (ref 80.0–100.0)
Monocytes Absolute: 0.5 10*3/uL (ref 0.1–1.0)
Monocytes Relative: 5 %
Neutro Abs: 6.2 10*3/uL (ref 1.7–7.7)
Neutrophils Relative %: 66 %
Platelets: 352 10*3/uL (ref 150–400)
RBC: 4.78 MIL/uL (ref 4.22–5.81)
RDW: 13.3 % (ref 11.5–15.5)
WBC: 9.5 10*3/uL (ref 4.0–10.5)
nRBC: 0 % (ref 0.0–0.2)

## 2021-09-18 LAB — TROPONIN I (HIGH SENSITIVITY)
Troponin I (High Sensitivity): 71 ng/L — ABNORMAL HIGH (ref <18)
Troponin I (High Sensitivity): 52 ng/L — ABNORMAL HIGH (ref <18)

## 2021-09-18 LAB — BASIC METABOLIC PANEL
Anion gap: 9 (ref 5–15)
BUN: 28 mg/dL — ABNORMAL HIGH (ref 6–20)
CO2: 22 mmol/L (ref 22–32)
Calcium: 9.1 mg/dL (ref 8.9–10.3)
Chloride: 109 mmol/L (ref 98–111)
Creatinine, Ser: 1.12 mg/dL (ref 0.61–1.24)
GFR, Estimated: >60 mL/min (ref >60)
Glucose, Bld: 101 mg/dL — ABNORMAL HIGH (ref 70–99)
Potassium: 5 mmol/L (ref 3.5–5.1)
Sodium: 140 mmol/L (ref 135–145)

## 2021-09-18 LAB — T4, FREE: Free T4: 0.69 ng/dL (ref 0.61–1.12)

## 2021-09-18 LAB — PROTIME-INR
INR: 1.1 (ref 0.8–1.2)
Prothrombin Time: 13.8 seconds (ref 11.4–15.2)

## 2021-09-18 LAB — BRAIN NATRIURETIC PEPTIDE: B Natriuretic Peptide: 543.5 pg/mL — ABNORMAL HIGH (ref 0.0–100.0)

## 2021-09-18 LAB — ETHANOL: Alcohol, Ethyl (B): <10 mg/dL (ref <10)

## 2021-09-18 LAB — MAGNESIUM: Magnesium: 2.4 mg/dL (ref 1.7–2.4)

## 2021-09-18 LAB — LACTIC ACID, PLASMA
Lactic Acid, Venous: 1.7 mmol/L (ref 0.5–1.9)
Lactic Acid, Venous: 1.7 mmol/L (ref 0.5–1.9)

## 2021-09-18 LAB — TSH: TSH: 4.374 u[IU]/mL (ref 0.350–4.500)

## 2021-09-18 MED ORDER — CALCIUM GLUCONATE 10 % IV SOLN
1.0000 g | Freq: Once | INTRAVENOUS | Status: AC
  Administered 2021-09-18: 1 g via INTRAVENOUS
  Filled 2021-09-18: qty 10

## 2021-09-18 MED ORDER — ENOXAPARIN SODIUM 80 MG/0.8ML IJ SOSY
0.5000 mg/kg | PREFILLED_SYRINGE | INTRAMUSCULAR | Status: DC
  Administered 2021-09-18: 65 mg via SUBCUTANEOUS
  Filled 2021-09-18: qty 0.65
  Filled 2021-09-18: qty 0.8

## 2021-09-18 MED ORDER — MELATONIN 5 MG PO TABS
5.0000 mg | ORAL_TABLET | Freq: Every evening | ORAL | Status: DC | PRN
Start: 2021-09-18 — End: 2021-09-19

## 2021-09-18 MED ORDER — INSULIN ASPART 100 UNIT/ML IJ SOLN
10.0000 [IU] | Freq: Once | INTRAMUSCULAR | Status: AC
  Administered 2021-09-18: 10 [IU] via INTRAVENOUS
  Filled 2021-09-18: qty 1

## 2021-09-18 MED ORDER — ACETAMINOPHEN 325 MG PO TABS: 650.0000 mg | ORAL_TABLET | Freq: Four times a day (QID) | ORAL | Status: DC | PRN

## 2021-09-18 MED ORDER — POLYETHYLENE GLYCOL 3350 17 G PO PACK: 17.0000 g | PACK | Freq: Every day | ORAL | Status: DC | PRN

## 2021-09-18 MED ORDER — SODIUM BICARBONATE 8.4 % IV SOLN
50.0000 meq | Freq: Once | INTRAVENOUS | Status: AC
  Administered 2021-09-18: 50 meq via INTRAVENOUS
  Filled 2021-09-18: qty 50

## 2021-09-18 MED ORDER — PROCHLORPERAZINE EDISYLATE 10 MG/2ML IJ SOLN
10.0000 mg | Freq: Four times a day (QID) | INTRAMUSCULAR | Status: DC | PRN
Start: 2021-09-18 — End: 2021-09-19

## 2021-09-18 MED ORDER — DEXTROSE 50 % IV SOLN
2.0000 | Freq: Once | INTRAVENOUS | Status: AC
  Administered 2021-09-18: 100 mL via INTRAVENOUS
  Filled 2021-09-18: qty 100

## 2021-09-18 MED ORDER — ATROPINE SULFATE 1 MG/10ML IJ SOSY
0.5000 mg | PREFILLED_SYRINGE | Freq: Once | INTRAMUSCULAR | Status: AC
  Administered 2021-09-18: 0.5 mg via INTRAVENOUS
  Filled 2021-09-18: qty 10

## 2021-09-18 NOTE — Consult Note (Signed)
Cardiology Consultation:   Patient ID: Christopher Nichols MRN: [id number]; DOB: 12-09-1985  Admit date: 09/18/2021 Date of Consult: 09/18/2021  PCP:  Gareth Morgan, MD   [facility] Providers Cardiologist:  None        Patient Profile:   Christopher Nichols is a 36 y.o. 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 Scotty Court.   History of Present Illness:   Christopher Nichols has been followed by cardiology at [facility] for his chronic cardiac conditions. He has a history of hypertrophic cardiomyopathy with ICD implantation in 2015. His first hospitalization was in 2012 with the complaint of chest pain. His work-up at that time included a MRI and TTE which showed findings of severe concentric LVH with questionable infiltration. Then again in 2014 he was admitted with chest pain and treated with IV hydration and was found to have pericarditis that was treated with NSAIDs. Outpatient he followed up with EP in 2015 and underwent subcutaneous ICD placement for LVH and presyncopal symptoms. He was admitted again in 08/2013 with chest pain and elevated troponins. This was felt to be demand ischemia related to severe LVH, dehydration, medical noncompliance, and pericarditis. He underwent cardiopulmonary testing in 12/2015 and was unable to reach target exercise capacity, deemed not impaired enough to pursue heart transplant work-up or referral to heart failure clinic. He has severe hypertropic (3cm septum) with dynamic LVOT obstruction. Patient was deemed not a good candidate for surgical myectomy or EtOH ablation due to diffuse concentric hypertrophy. He was admitted to [facility] 08/16/2021 for RUQ pain and had cardiac consultation for surgical clearance for possible cholecystectomy. His symptoms improved and he never did require surgery.   The patient is being admitted  09/18/21 for near syncope event at work and was found to have a heart rate in the 30's. With current labs and a potassium of 6.8, he was treated with calcium gluconate 1 gm, sodium bicarbonate 50 mEq, insulin 10 units IV, D50 IV, and atropine 0.5 mg IVP for heart rate of 38. After treatment recheck of potassium was found to be 5. Continues to have pacing pads on his chest connected to the defibrillator that is at the bedside.    Past Medical History:  Diagnosis Date   Cardiac defibrillator in place    Cardiomyopathy [facility])     No past surgical history on file.   Home Medications:  Prior to Admission medications   Medication Sig Start Date End Date Taking? Authorizing Provider  metoprolol (LOPRESSOR) 100 MG tablet Take 100 mg by mouth 2 (two) times daily.  08/24/15  Yes [provider]  verapamil (CALAN-SR) 240 MG CR tablet Take 240 mg by mouth 2 (two) times daily. 09/15/21  Yes [provider]  verapamil (VERELAN PM) 240 MG 24 hr capsule Take 240 mg by mouth 2 (two) times daily.   Yes [provider]    Inpatient Medications: Scheduled Meds:  Continuous Infusions:  PRN Meds:   Allergies:    Allergies  Allergen Reactions   Clarithromycin     REACTION: Stomach upset    Social History:   Social History   Socioeconomic History   Marital status: Married    Spouse name: Not on file   Number of children: Not on file   Years of education: Not on file   Highest education level: Not on  file  Occupational History   Not on file  Tobacco Use   Smoking status: Never   Smokeless tobacco: Never  Substance and Sexual Activity   Alcohol use: No   Drug use: No   Sexual activity: Not on file  Other Topics Concern   Not on file  Social History Narrative   Not on file   Social Determinants of Health   Financial Resource Strain: Not on file  Food Insecurity: Not on file  Transportation Needs: Not on file  Physical Activity: Not on file  Stress: Not  on file  Social Connections: Not on file  Intimate Partner Violence: Not on file    Family History:    Family History  Problem Relation Age of Onset   Healthy Mother    Healthy Father      ROS:  Please see the history of present illness.  Review of Systems  Constitutional:  Positive for diaphoresis and malaise/fatigue.  HENT: Negative.    Eyes: Negative.   Respiratory:  Positive for shortness of breath.   Cardiovascular:        Near syncope  Gastrointestinal: Negative.   Genitourinary: Negative.   Musculoskeletal: Negative.   Skin: Negative.   Neurological:  Positive for weakness.  Endo/Heme/Allergies: Negative.   Psychiatric/Behavioral: Negative.     All other ROS reviewed and negative.     Physical Exam/Data:   Vitals:   09/18/21 1300 09/18/21 1330 09/18/21 1400 09/18/21 1430  BP: 106/72 117/78 120/77 123/77  Pulse: (!) 38 72 (!) 59 (!) 57  Resp: Temp:      TempSrc:      SpO2: 100% 97% 98% 97%  Weight:      Height:       No intake or output data in the 24 hours ending 09/18/21 1444    09/18/2021   10:49 AM 08/18/2021    5:00 AM 08/17/2021    5:00 AM  Last 3 Weights  Weight (lbs) 282 lb 10.1 oz 282 lb 10.1 oz 263 lb  Weight (kg) 128.2 kg 128.2 kg 119.296 kg     Body mass index is 37.29 kg/m.  General:  Well nourished, well developed, in no acute distress HEENT: normal, glasses on Neck: no JVD appreciated Vascular: No carotid bruits; Distal pulses 2+ bilaterally Cardiac:  normal S1, S2; RRR; II/VI systolic murmur  Lungs:  clear to auscultation bilaterally, no wheezing, rhonchi or rales, respirations are unlabored on room air Abd: soft, nontender, no hepatomegaly, obese, bowel sounds present in all 4 quadrants Ext: no edema Musculoskeletal:  No deformities, BUE and BLE strength normal and equal Skin: warm and dry  Neuro:  CNs 2-12 intact, no focal abnormalities noted Psych:  Normal affect   EKG:  The EKG was personally reviewed and  demonstrates:  SB with RBBB, limb leads were reversed Telemetry:  Telemetry was personally reviewed and demonstrates:  SR rate of 62 with RBBB  Relevant CV Studies: Echocardiogram completed 08/17/2021 1. Left ventricular ejection fraction, by estimation, is 55 to 60%. The  left ventricle has normal function. The left ventricle has no regional  wall motion abnormalities. There is severe left ventricular hypertrophy.  Left ventricular diastolic parameters   are consistent with Grade III diastolic dysfunction (restrictive).lLVOT  gradient measured at 26 to 30 mm Hg, up to 43-47 mm Hg with valsalva.   2. Right ventricular systolic function is normal. The right ventricular  size is normal. There is normal pulmonary artery systolic  pressure. The  estimated right ventricular systolic pressure is 34.0 mmHg.   3. Left atrial size was severely dilated.   4. The mitral valve is normal in structure. Mild mitral valve  regurgitation. No evidence of mitral stenosis.   5. The aortic valve was not well visualized. Aortic valve regurgitation  is not visualized. No aortic stenosis is present.   6. The inferior vena cava is dilated in size with <50% respiratory  variability, suggesting right atrial pressure of 15 mmHg.   Cardiopulmonary stress test 12/2015 Conclusions: Results are consistent with mild cardiopulmonary disease at sub-optimal workload (RER <1.0).  Echocardiogram 09/2014 Severe LVH (>75% ), DD, elevated LVEDDP, dilated atria, SAM. Septal thickness measured on prior study ar 3 cm.  ZIO Patch:  Predominantly sinus without significant arrhythmias  Cardiac MRI 03/05/13 Diffusely thickened LV myocardium compatible with hypertrophic obstructive cardiomyopathy. SAM and turbulent flow in the LVOT are evident. LV cavity is completely obliterated during systole. Laboratory Data:  High Sensitivity Troponin:   Recent Labs  Lab 09/18/21 1056  TROPONINIHS 52*  71*     Chemistry Recent Labs  Lab  09/18/21 1056  NA 140  136  K 5.0  6.8*  CL 109  106  CO2 22  24  GLUCOSE 101*  183*  BUN 28*  28*  CREATININE 1.12  1.20  CALCIUM 9.1  9.1  MG 2.4  GFRNONAA >60  >60  ANIONGAP 9  6    Recent Labs  Lab 09/18/21 1056  PROT 7.2  ALBUMIN 4.5  AST 38  ALT 71*  ALKPHOS 50  BILITOT 1.1   Lipids No results for input(s): CHOL, TRIG, HDL, LABVLDL, LDLCALC, CHOLHDL in the last 168 hours.  Hematology Recent Labs  Lab 09/18/21 1056  WBC 9.5  RBC 4.78  HGB 14.4  HCT 44.0  MCV 92.1  MCH 30.1  MCHC 32.7  RDW 13.3  PLT 352   Thyroid  Recent Labs  Lab 09/18/21 1056  TSH 4.374  FREET4 0.69    BNP Recent Labs  Lab 09/18/21 1056  BNP 543.5*    DDimer No results for input(s): DDIMER in the last 168 hours.   Radiology/Studies:  DG Chest Portable 1 View  Result Date: 09/18/2021 CLINICAL DATA:  Bradycardia, dizziness, syncope EXAM: PORTABLE CHEST 1 VIEW COMPARISON:  Previous chest radiographs done on 02/18/2017 and CT chest done on 08/16/2021 FINDINGS: Transverse diameter of heart is increased. There is lead in the left side of chest. There are no signs of alveolar pulmonary edema or focal pulmonary consolidation. IMPRESSION: Cardiomegaly. There are no signs of pulmonary edema or focal pulmonary consolidation. Electronically Signed   By: Ernie AvenaPalani  Rathinasamy M.D.   On: 09/18/2021 11:25     Assessment and Plan:   Symptomatic bradycardia with near syncope - Interrogate device, patient denies any shocks, mother in the room states that have interrogation device at home but are unable to use it because they do not have a land line so unsure of last interrogation, RN in the emergency department attempted but with location of device portable unit is unable to retreive date from his device - Transcutaneous pacing pads on  - Received 0.5 mg atropine, would avoid with history of hypertrophic cardiomyopathy - Heart rate has improved with corrected potassium from 6.8 to 5 -  Repeat EKG, arm leads were reversed on initial tracing - PTA metoprolol and verapamil, may require dosing change after electrolytes corrected - Continue with cardiac monitor - EKG PRN  2. Hyperkalemia -  Potassium 6.8 - Treated and repeated with result 5 - Monitor, trend, and treat as needed - BMP this evening and then daily  3. Familial hypertrophic obstructive cardiomyopathy - Recent echocardiogram done 08/17/21 no need to repeat - Heart rate has improved baseline 50-60's - PTA metoprolol and verapamil, may require dosing change after electrolytes corrected - Will need to continue with follow up with [facility]  4. AICD - Patient denies any shocks - Interrogation ordered and pending  - Last generator changed 12/08/2020 - Follow[facility] at [facility]  Risk Assessment/Risk Scores:                For questions or updates, please contact [facility] Please consult [email or web address] for contact info under    Signed, Meira Wahba, NP  09/18/2021 2:44 PM

## 2021-09-18 NOTE — ED Notes (Signed)
RN unable to interrogate pt defibrillator. RN notified Vincenza Hews with [employer] for pt to have interrogation.

## 2021-09-18 NOTE — H&P (Signed)
History and Physical  Christopher Nichols [id number] DOB: [birth year] DOA: 09/18/2021  Referring physician: Dr. Joni Fears, [place]  PCP: Lemmie Evens, MD  Outpatient Specialists: Cardiology, neurology. Patient coming from: Home through work via [employer], brought in by his wife.  Chief Complaint: Near syncope  HPI: Christopher Nichols is a 36 y.o. male with medical history significant for hypertrophic cardiomyopathy status post ICD placement in 2015 and in 2022, history of recurrent pericarditis in 2014, mild development delay, HFpEF with grade 3 diastolic dysfunction, restrictive, who presented to [facility] ED from work after near syncopal episode at work.  Patient was in his usual state of health prior to this.  He ate a breakfast and stayed hydrated as he normally does.  While at work, he felt lightheaded with standing, sat down but still felt lightheaded.  Felt nauseous without vomiting.  No chest pain.  No palpitations.  No dyspnea.  No loss of consciousness.  No falls.  Called his wife who brought him to the ED.  Lightheadedness persisted in the ED until he laid down supine on the stretcher.  Persistently bradycardic in the ED.  He took his home Metoprolol and verapamil this morning prior to going to work as he normally does.  Upon assessment in the ED, he was found to have a heart rate in the 30s.  Cardiology was consulted by EDP with plan for ICD interrogation.  The patient's heart rate improved, but he is still bradycardic with rates in the 50's while in the ED.  EDP requested admission for further management.  The patient was admitted by hospitalist service, [facility].  ED Course: Tmax 97.8.  BP 123/77.  Heart rate 57.  Respiration rate 19.  O2 saturation 97% on room air.  Lab studies remarkable for initial potassium 6.8 (likely erroneous), repeated without treatment 5.0.  BUN 28, creatinine 1.12.  ALT 71.  BNP 543.  Troponin 52.  Review of Systems: Review of systems as noted in the HPI. All  other systems reviewed and are negative.   Past Medical History:  Diagnosis Date   Cardiac defibrillator in place    Cardiomyopathy [facility])    No past surgical history on file.  Social History:  reports that he has never smoked. He has never used smokeless tobacco. He reports that he does not drink alcohol and does not use drugs.   Allergies  Allergen Reactions   Clarithromycin     REACTION: Stomach upset    Family History  Problem Relation Age of Onset   Healthy Mother    Healthy Father       Prior to Admission medications   Medication Sig Start Date End Date Taking? Authorizing Provider  metoprolol (LOPRESSOR) 100 MG tablet Take 100 mg by mouth 2 (two) times daily.  08/24/15  Yes [provider]  verapamil (CALAN-SR) 240 MG CR tablet Take 240 mg by mouth 2 (two) times daily. 09/15/21  Yes [provider]  verapamil (VERELAN PM) 240 MG 24 hr capsule Take 240 mg by mouth 2 (two) times daily.   Yes [provider]    Physical Exam: BP 123/77   Pulse (!) 57   Temp 97.8 F (36.6 C) (Oral)   Resp 19   Ht 6\' 1"  (1.854 m)   Wt 128.2 kg   SpO2 97%   BMI 37.29 kg/m   General: 36 y.o. year-old male well developed well nourished in no acute distress.  Alert and oriented x3. Cardiovascular: Bradycardic with no rubs or gallops.  No thyromegaly or JVD noted.  No lower extremity edema. 2/4 pulses in all 4 extremities. Respiratory: Clear to auscultation with no wheezes or rales. Good inspiratory effort. Abdomen: Soft nontender nondistended with normal bowel sounds x4 quadrants. Muskuloskeletal: No cyanosis, clubbing or edema noted bilaterally Neuro: CN II-XII intact, strength, sensation, reflexes Skin: No ulcerative lesions noted or rashes Psychiatry: Judgement and insight appear normal. Mood is appropriate for condition and setting          Labs on Admission:  Basic Metabolic Panel: Recent Labs  Lab 09/18/21 1056  NA 140  136  K 5.0  6.8*  CL  109  106  CO2 22  24  GLUCOSE 101*  183*  BUN 28*  28*  CREATININE 1.12  1.20  CALCIUM 9.1  9.1  MG 2.4  PHOS 3.7   Liver Function Tests: Recent Labs  Lab 09/18/21 1056  AST 38  ALT 71*  ALKPHOS 50  BILITOT 1.1  PROT 7.2  ALBUMIN 4.5   No results for input(s): LIPASE, AMYLASE in the last 168 hours. No results for input(s): AMMONIA in the last 168 hours. CBC: Recent Labs  Lab 09/18/21 1056  WBC 9.5  NEUTROABS 6.2  HGB 14.4  HCT 44.0  MCV 92.1  PLT 352   Cardiac Enzymes: No results for input(s): CKTOTAL, CKMB, CKMBINDEX, TROPONINI in the last 168 hours.  BNP (last 3 results) Recent Labs    08/16/21 1957 09/18/21 1056  BNP 636.2* 543.5*    ProBNP (last 3 results) No results for input(s): PROBNP in the last 8760 hours.  CBG: No results for input(s): GLUCAP in the last 168 hours.  Radiological Exams on Admission: DG Chest Portable 1 View  Result Date: 09/18/2021 CLINICAL DATA:  Bradycardia, dizziness, syncope EXAM: PORTABLE CHEST 1 VIEW COMPARISON:  Previous chest radiographs done on 02/18/2017 and CT chest done on 08/16/2021 FINDINGS: Transverse diameter of heart is increased. There is lead in the left side of chest. There are no signs of alveolar pulmonary edema or focal pulmonary consolidation. IMPRESSION: Cardiomegaly. There are no signs of pulmonary edema or focal pulmonary consolidation. Electronically Signed   By: Elmer Picker M.D.   On: 09/18/2021 11:25    EKG: I independently viewed the EKG done and my findings are as followed: Atrial ventricular rhythm rate of 38.  Nonspecific ST-T changes.  QTc 453  Assessment/Plan Present on Admission:  Near syncope  Principal Problem:   Near syncope  Near syncope with history of hypertrophic cardiomyopathy post ICD placement in 2015 and 2022 Bradycardic on presentation with HR in the 30's Seen by cardiology with plan for ICD interrogation. The patient had a recent complete echocardiogram about a  month ago Defer to cardiology to repeat echocardiogram. Closely monitor on telemetry cardiac unit. We will hold off on IV fluid due to elevated BNP greater than 500.  Defer to cardiology for IV hydration if indicated. Holding off home metoprolol and verapamil due to bradycardia, deferring to cardiology to restart. Obtain orthostatic vital signs daily x 2 days Rest of management per cardiology.  Elevated troponin, suspect demand ischemia in the setting of near syncope and bradycardia Initial troponin 52 Denies any anginal symptoms. No evidence of acute ischemia on 12-lead EKG Management per cardiology  Bradycardia with ICD placement Hold off home Beta blocker and calcium channel blocker Management per cardiology  Isolated elevated ALT Nonspecific Repeat chemistry panel in the morning  HFpEF with grade 3 diastolic dysfunction, restrictive Last 2D echo 08/17/2021, showing severe  left ventricular hypertrophy, with grade 3 diastolic dysfunction, mild mitral valve regurgitation, left atrium severely dilated. Start strict I's and O's and daily weight  Hyperkalemia Initial reading 6.8 likely erroneous Repeated potassium 5.0 without treatment. Repeat chemistry panel in the AM   DVT prophylaxis: Subcu Lovenox daily  Code Status: Full code  Family Communication: Mother and wife at bedside  Disposition Plan: Admitted to telemetry cardiac unit  Consults called: Cardiology consulted by EDP  Admission status: Observation status   Status is: Observation    Kayleen Memos MD [facility] Pager [phone]  If 7PM-7AM, please contact night-coverage [email or web address] Password [email or web address]  09/18/2021, 2:48 PM

## 2021-09-18 NOTE — ED Triage Notes (Signed)
Pt here after a syncopal episode today. Pt states he was working and then felt lightheaded and sat down and then states "everything went dark". Pt HR in the 30s. Pt has a recent defibrillator/.

## 2021-09-18 NOTE — ED Provider Notes (Signed)
[facility] Provider Note    Event Date/Time   First MD Initiated Contact with Patient 09/18/21 1047     (approximate)   History   Chief Complaint: Near Syncope   HPI  Christopher Nichols is a 36 y.o. male with a past history of hokum status post pacemaker/AICD placement in 2015 for primary prevention with subsequent pulse generator change December 08, 2020, managed by [facility] cardiology Dr. Stark Klein who comes the ED today due to near syncope.  He reports feeling in his usual state of health, at work, when he stood up he felt like he was going to pass out.  He sat back down and felt better.  Denies any chest pain or shortness of breath, denies losing consciousness.  Denies any falls or trauma.  No recent illness.  No vision changes headaches or focal paresthesia or motor weakness.     Physical Exam   Triage Vital Signs: ED Triage Vitals [09/18/21 1049]  Enc Vitals Group     BP 113/72     Pulse Rate (!) 37     Resp 16     Temp 97.8 F (36.6 C)     Temp Source Oral     SpO2 99 %     Weight 282 lb 10.1 oz (128.2 kg)     Height 6\' 1"  (1.854 m)     Head Circumference      Peak Flow      Pain Score 0     Pain Loc      Pain Edu?      Excl. in [place]?     Most recent vital signs: Vitals:   09/18/21 1400 09/18/21 1430  BP: 120/77 123/77  Pulse: (!) 59 (!) 57  Resp: 17 19  Temp:    SpO2: 98% 97%    General: Awake, no distress.  CV:  Good peripheral perfusion.  Bradycardia, heart rate of 40.  Symmetric peripheral pulses.  Systolic heart murmur. Resp:  Normal effort.  Clear to auscultation bilaterally Abd:  No distention.  Soft nontender Other:  No lower extremity edema.  Normal mental status.   ED Results / Procedures / Treatments   Labs (all labs ordered are listed, but only abnormal results are displayed) Labs Reviewed  COMPREHENSIVE METABOLIC PANEL - Abnormal; Notable for the following components:      Result Value   Potassium 6.8 (*)     Glucose, Bld 183 (*)    BUN 28 (*)    ALT 71 (*)    All other components within normal limits  BRAIN NATRIURETIC PEPTIDE - Abnormal; Notable for the following components:   B Natriuretic Peptide 543.5 (*)    All other components within normal limits  BASIC METABOLIC PANEL - Abnormal; Notable for the following components:   Glucose, Bld 101 (*)    BUN 28 (*)    All other components within normal limits  TROPONIN I (HIGH SENSITIVITY) - Abnormal; Notable for the following components:   Troponin I (High Sensitivity) 71 (*)    All other components within normal limits  TROPONIN I (HIGH SENSITIVITY) - Abnormal; Notable for the following components:   Troponin I (High Sensitivity) 52 (*)    All other components within normal limits  LACTIC ACID, PLASMA  LACTIC ACID, PLASMA  CBC WITH DIFFERENTIAL/PLATELET  PROTIME-INR  MAGNESIUM  PHOSPHORUS  TSH  T4, FREE  ETHANOL     EKG Interpreted by me Complete heart block, rate of 38, right  axis, right bundle branch block.  No acute ischemic changes.  No clear pacer spikes   RADIOLOGY Chest x-ray viewed and interpreted by me, appears normal.  Radiology report reviewed.   PROCEDURES:  .Critical Care Performed by: Carrie Mew, MD Authorized by: Carrie Mew, MD   Critical care provider statement:    Critical care time (minutes):  35   Critical care time was exclusive of:  Separately billable procedures and treating other patients   Critical care was necessary to treat or prevent imminent or life-threatening deterioration of the following conditions:  Sepsis, respiratory failure, cardiac failure and circulatory failure   Critical care was time spent personally by me on the following activities:  Development of treatment plan with patient or surrogate, discussions with consultants, evaluation of patient's response to treatment, examination of patient, obtaining history from patient or surrogate, ordering and performing treatments  and interventions, ordering and review of laboratory studies, ordering and review of radiographic studies, pulse oximetry, re-evaluation of patient's condition and review of old charts   Care discussed with: admitting provider   Comments:        .1-3 Lead EKG Interpretation Performed by: Carrie Mew, MD Authorized by: Carrie Mew, MD     Interpretation: abnormal     ECG rate:  38   ECG rate assessment: bradycardic     Rhythm: A-V block     Ectopy: none     Conduction: abnormal     MEDICATIONS ORDERED IN ED: Medications  enoxaparin (LOVENOX) injection 40 mg (has no administration in time range)  atropine 1 MG/10ML injection 0.5 mg (0.5 mg Intravenous Given 09/18/21 1112)  calcium gluconate inj 10% (1 g) URGENT USE ONLY! (1 g Intravenous Given 09/18/21 1218)  sodium bicarbonate injection 50 mEq (50 mEq Intravenous Given 09/18/21 1217)  insulin aspart (novoLOG) injection 10 Units (10 Units Intravenous Given 09/18/21 1220)  dextrose 50 % solution 100 mL (100 mLs Intravenous Given 09/18/21 1217)     IMPRESSION / MDM / [place] / ED COURSE  I reviewed the triage vital signs and the nursing notes.                              Differential diagnosis includes, but is not limited to, electrolyte abnormality, anemia, AICD failure, hypothyroidism, AV nodal blocker overdose, ACS, pulmonary edema, dehydration  Patient's presentation is most consistent with acute presentation with potential threat to life or bodily function.     Clinical Course as of 09/18/21 1450  Tue Sep 18, 2021  1107 Patient presents with symptomatic bradycardia, near syncope with standing.  While lying supine, he is currently asymptomatic with normal blood pressure.  Does not require transcutaneous pacing at present time but he is on monitor with pacer pads in place.  We will give a dose of IV atropine while checking labs and chest x-ray.  Will interrogate device, we will plan for cardiology  consultation and admission. [PS]  1115 Case d/w cardiology who will come evaluate.  [PS]  1147 Potassium(!!): 6.8 Will give calcium / bicarb / insulin to see if this improves HR.  [PS]  1409 Potassium: 5.0 Repeat potassium 5.0.  On reassessment, patient's heart rate has improved to 59.  Blood pressure remains normal.  He is now sitting upright and remains asymptomatic. [PS]    Clinical Course User Index [PS] Carrie Mew, MD    ----------------------------------------- 2:49 PM on 09/18/2021 ----------------------------------------- Discussed with hospitalist for further  management   FINAL CLINICAL IMPRESSION(S) / ED DIAGNOSES   Final diagnoses:  Symptomatic bradycardia  HOCM (hypertrophic obstructive cardiomyopathy) ([place])     Rx / DC Orders   ED Discharge Orders     None        Note:  This document was prepared using Dragon voice recognition software and may include unintentional dictation errors.   Carrie Mew, MD 09/18/21 1450

## 2021-09-18 NOTE — Plan of Care (Signed)
Hosp and Cardio informed. - patient now in Rm 33, waiting on admit bed, but family requesting patienit be transferred to [facility] if possible.  Has running medical hx with them and their cardiologist + have already been talking about need for possible pacer.

## 2021-09-19 DIAGNOSIS — R001 Bradycardia, unspecified: Secondary | ICD-10-CM | POA: Diagnosis not present

## 2021-09-19 DIAGNOSIS — E669 Obesity, unspecified: Secondary | ICD-10-CM | POA: Diagnosis not present

## 2021-09-19 DIAGNOSIS — I421 Obstructive hypertrophic cardiomyopathy: Secondary | ICD-10-CM | POA: Diagnosis not present

## 2021-09-19 DIAGNOSIS — R55 Syncope and collapse: Secondary | ICD-10-CM | POA: Diagnosis not present

## 2021-09-19 LAB — COMPREHENSIVE METABOLIC PANEL
ALT: 76 U/L — ABNORMAL HIGH (ref 0–44)
AST: 44 U/L — ABNORMAL HIGH (ref 15–41)
Albumin: 4.1 g/dL (ref 3.5–5.0)
Alkaline Phosphatase: 48 U/L (ref 38–126)
Anion gap: 5 (ref 5–15)
BUN: 20 mg/dL (ref 6–20)
CO2: 27 mmol/L (ref 22–32)
Calcium: 8.8 mg/dL — ABNORMAL LOW (ref 8.9–10.3)
Chloride: 106 mmol/L (ref 98–111)
Creatinine, Ser: 0.84 mg/dL (ref 0.61–1.24)
GFR, Estimated: >60 mL/min (ref >60)
Glucose, Bld: 106 mg/dL — ABNORMAL HIGH (ref 70–99)
Potassium: 4.2 mmol/L (ref 3.5–5.1)
Sodium: 138 mmol/L (ref 135–145)
Total Bilirubin: 1 mg/dL (ref 0.3–1.2)
Total Protein: 6.8 g/dL (ref 6.5–8.1)

## 2021-09-19 LAB — CBC
HCT: 42.3 % (ref 39.0–52.0)
Hemoglobin: 13.9 g/dL (ref 13.0–17.0)
MCH: 30 pg (ref 26.0–34.0)
MCHC: 32.9 g/dL (ref 30.0–36.0)
MCV: 91.4 fL (ref 80.0–100.0)
Platelets: 213 10*3/uL (ref 150–400)
RBC: 4.63 MIL/uL (ref 4.22–5.81)
RDW: 13.3 % (ref 11.5–15.5)
WBC: 6.2 10*3/uL (ref 4.0–10.5)
nRBC: 0 % (ref 0.0–0.2)

## 2021-09-19 LAB — MAGNESIUM: Magnesium: 2.4 mg/dL (ref 1.7–2.4)

## 2021-09-19 LAB — PHOSPHORUS: Phosphorus: 4.3 mg/dL (ref 2.5–4.6)

## 2021-09-19 LAB — PACEMAKER DEVICE OBSERVATION

## 2021-09-19 NOTE — Progress Notes (Signed)
Progress Note  Patient Name: Christopher Nichols Date of Encounter: 09/19/2021  [facility] Cardiologist: [facility] Cardiology  Subjective   Patient seen on AM rounds. Denies any chest pain, shortness of breath, lightheadedness, or palpitations. Remains SB-SR with heart rates ranging from 55-70. Metoprolol and verapamil continued to be held. Potassium on admission was 6.8 and this morning 4.2.  Inpatient Medications    Scheduled Meds:  enoxaparin (LOVENOX) injection  0.5 mg/kg Subcutaneous Q24H   Continuous Infusions:  PRN Meds: acetaminophen, melatonin, polyethylene glycol, prochlorperazine   Vital Signs    Vitals:   09/18/21 2021 09/18/21 2349 09/19/21 0645 09/19/21 0812  BP: 136/85 123/69 137/85 120/76  Pulse: (!) 55 73 67 69  Resp: 18 18 18 20   Temp: 98 F (36.7 C) 98 F (36.7 C)  98.5 F (36.9 C)  TempSrc: Oral     SpO2: 100% 99% 98% 98%  Weight:      Height:        Intake/Output Summary (Last 24 hours) at 09/19/2021 1131 Last data filed at 09/18/2021 2200 Gross per 24 hour  Intake 240 ml  Output 200 ml  Net 40 ml      09/18/2021   10:49 AM 08/18/2021    5:00 AM 08/17/2021    5:00 AM  Last 3 Weights  Weight (lbs) 282 lb 10.1 oz 282 lb 10.1 oz 263 lb  Weight (kg) 128.2 kg 128.2 kg 119.296 kg      Telemetry    SR rate of 70 - Personally Reviewed  ECG    No new tracings - Personally Reviewed  Physical Exam   GEN: Well nourished, well developed, in no acute distress.Sitting upright in the recliner Neck: No JVD appreciated Cardiac: RRR,  II/VI systolic murmur , without rubs, or gallops.  Respiratory: Clear to auscultation bilaterally. Respirations are unlabored on room air. GI: Soft, nontender, non-distended, obese, bowel sounds present in all four quadrants MS: No edema; No deformity. Neuro:  Nonfocal  Psych: Normal affect   Labs    High Sensitivity Troponin:   Recent Labs  Lab 09/18/21 1056  TROPONINIHS 52*  71*     Chemistry Recent  Labs  Lab 09/18/21 1056 09/19/21 0704  NA 140  136 138  K 5.0  6.8* 4.2  CL 109  106 106  CO2 22  24 27   GLUCOSE 101*  183* 106*  BUN 28*  28* 20  CREATININE 1.12  1.20 0.84  CALCIUM 9.1  9.1 8.8*  MG 2.4 2.4  PROT 7.2 6.8  ALBUMIN 4.5 4.1  AST 38 44*  ALT 71* 76*  ALKPHOS 50 48  BILITOT 1.1 1.0  GFRNONAA >60  >60 >60  ANIONGAP 9  6 5     Lipids No results for input(s): CHOL, TRIG, HDL, LABVLDL, LDLCALC, CHOLHDL in the last 168 hours.  Hematology Recent Labs  Lab 09/18/21 1056 09/19/21 0704  WBC 9.5 6.2  RBC 4.78 4.63  HGB 14.4 13.9  HCT 44.0 42.3  MCV 92.1 91.4  MCH 30.1 30.0  MCHC 32.7 32.9  RDW 13.3 13.3  PLT 352 213   Thyroid  Recent Labs  Lab 09/18/21 1056  TSH 4.374  FREET4 0.69    BNP Recent Labs  Lab 09/18/21 1056  BNP 543.5*    DDimer No results for input(s): DDIMER in the last 168 hours.   Radiology    DG Chest Portable 1 View  Result Date: 09/18/2021 CLINICAL DATA:  Bradycardia, dizziness, syncope EXAM: PORTABLE CHEST 1  VIEW COMPARISON:  Previous chest radiographs done on 02/18/2017 and CT chest done on 08/16/2021 FINDINGS: Transverse diameter of heart is increased. There is lead in the left side of chest. There are no signs of alveolar pulmonary edema or focal pulmonary consolidation. IMPRESSION: Cardiomegaly. There are no signs of pulmonary edema or focal pulmonary consolidation. Electronically Signed   By: Ernie Avena M.D.   On: 09/18/2021 11:25    Cardiac Studies  Echocardiogram completed on 08/17/2021  1. Left ventricular ejection fraction, by estimation, is 55 to 60%. The  left ventricle has normal function. The left ventricle has no regional  wall motion abnormalities. There is severe left ventricular hypertrophy.  Left ventricular diastolic parameters   are consistent with Grade III diastolic dysfunction (restrictive).lLVOT  gradient measured at 26 to 30 mm Hg, up to 43-47 mm Hg with valsalva.   2. Right ventricular  systolic function is normal. The right ventricular  size is normal. There is normal pulmonary artery systolic pressure. The  estimated right ventricular systolic pressure is 34.0 mmHg.   3. Left atrial size was severely dilated.   4. The mitral valve is normal in structure. Mild mitral valve  regurgitation. No evidence of mitral stenosis.   5. The aortic valve was not well visualized. Aortic valve regurgitation  is not visualized. No aortic stenosis is present.   6. The inferior vena cava is dilated in size with <50% respiratory  variability, suggesting right atrial pressure of 15 mmHg.   Patient Profile     36 y.o. male with a history of familial HOCM s/p subcutaneous ICD placement in 07/2013 with generator change done 12/08/2020, current pericarditis, gout, and ADHD, who is being evaluated for near syncope and symptomatic bradycardia.  Assessment & Plan      1.  Symptomatic bradycardia with near syncope -Awaiting device interrogation his [employer] rep is coming from [place] today, patient denies any shocks, mother stated they have an interrogation device at home but have been unable to use that, question if he has been lost to follow-up -Continues to have the transcutaneous pacing pads on -Heart rate has improved from 55-70 since correction of hyperkalemia and his medications of metoprolol and verapamil are on hold -Recommend continue holding his home medications of metoprolol and verapamil at this time -Recommend he follow-up with his primary cardiologist at [facility] -Recommend he continue to ambulate around the room to ensure that his heart rate increases as anticipated  2. Hyperkalemia now resolved -Potassium on admission 6.8 -Potassium this morning 4.2 -No changes in diet, supplements, or energy drinks and Gatorade, kidney function remains normal, unknown etiology  3.  Familial hypertrophic obstructive cardiomyopathy -Recent echocardiogram done 08/17/2021 so no need to repeat  at this time -Heart rate has improved to baseline of 50s and 60s -Recommend continue to hold metoprolol and verapamil until follows up with [facility]  4.  Subcutaneous ICD Conservation officer, [employer]) -Interrogation is ordered and pending awaiting representative from [employer] to arrive -Last generator change 12/08/2020 -Follows with primary cardiology at [facility]     For questions or updates, please contact [facility] Please consult [email or web address] for contact info under        Signed, Viva Gallaher, NP  09/19/2021, 11:31 AM

## 2021-09-19 NOTE — Progress Notes (Signed)
  Transition of Care [facility]) Screening Note   Patient Details  Name: Christopher Nichols Date of Birth: 06/05/1985   Transition of Care [facility]) CM/SW Contact:    Gildardo Griffes, LCSW Phone Number: 09/19/2021, 11:07 AM    Transition of Care Department [facility]) has reviewed patient and no TOC needs have been identified at this time. We will continue to monitor patient advancement through interdisciplinary progression rounds. If new patient transition needs arise, please place a TOC consult.  [place], [place] [phone]

## 2021-09-19 NOTE — Hospital Course (Signed)
36 year old male with past medical history of hypertrophic cardiomyopathy status post ICD placement in 2015 as well as history of recurrent pericarditis and systolic/diastolic heart failure presented to the emergency room on 5/30 after near syncopal episode.  Patient found to have heart rate in the 30s and brought in for further evaluation.  Cardiology consulted.  It was felt that issues were related to patient's metoprolol and verapamil and it was recommended the patient discontinue both medications at this time.  Defibrillator was interrogated without any findings.  Patient will follow-up with [facility] cardiology in the next few weeks.

## 2021-09-20 DIAGNOSIS — E669 Obesity, unspecified: Secondary | ICD-10-CM | POA: Diagnosis present

## 2021-09-20 NOTE — Assessment & Plan Note (Signed)
Felt to be secondary to patient's AV nodal agents.  His metoprolol and verapamil were put on hold by cardiology.  By 5/31, heart rate normalized in the 60s.  Patient feeling fine with no further issues.  His defibrillator was interrogated by the product company and found to have no issues.

## 2021-09-20 NOTE — Discharge Summary (Signed)
Physician Discharge Summary   Patient: Christopher Nichols MRN: [id number] DOB: 1985-12-28  Admit date:     09/18/2021  Discharge date: 09/19/2021  Discharge Physician: Hollice Espy   PCP: Gareth Morgan, MD   Recommendations at discharge:   Patient will follow-up with [facility] cardiology in the next few weeks, Dr. Graciela Husbands Medication change: Metoprolol and verapamil on hold until patient follows up with cardiology  Discharge Diagnoses: Principal Problem:   Near syncope Active Problems:   Symptomatic bradycardia   Obesity (BMI 30-39.9)  Resolved Problems:   * No resolved hospital problems. *  Hospital Course: 36 year old male with past medical history of hypertrophic cardiomyopathy status post ICD placement in 2015 as well as history of recurrent pericarditis and systolic/diastolic heart failure presented to the emergency room on 5/30 after near syncopal episode.  Patient found to have heart rate in the 30s and brought in for further evaluation.  Cardiology consulted.  It was felt that issues were related to patient's metoprolol and verapamil and it was recommended the patient discontinue both medications at this time.  Defibrillator was interrogated without any findings.  Patient will follow-up with [facility] cardiology in the next few weeks.  Assessment and Plan: * Near syncope Secondary to above.  With correction of bradycardia, no further syncopal or near syncopal events.  Symptomatic bradycardia Felt to be secondary to patient's AV nodal agents.  His metoprolol and verapamil were put on hold by cardiology.  By 5/31, heart rate normalized in the 60s.  Patient feeling fine with no further issues.  His defibrillator was interrogated by the product company and found to have no issues.  Obesity (BMI 30-39.9) Meets criteria with BMI greater than 30         Consultants: Cardiology Procedures performed: Interrogation of defibrillator Disposition: Home Diet recommendation:   Discharge Diet Orders (From admission, onward)     Start     Ordered   09/19/21 0000  Diet - low sodium heart healthy        09/19/21 1131           Regular diet DISCHARGE MEDICATION: Allergies as of 09/19/2021       Reactions   Clarithromycin    REACTION: Stomach upset        Medication List     STOP taking these medications    metoprolol tartrate 100 MG tablet Commonly known as: LOPRESSOR   verapamil 240 MG CR tablet Commonly known as: CALAN-SR        Follow-up Information     Mittie Bodo, MD Follow up in 2 week(s).   Specialty: Cardiology Contact information: [address] [place] [address] [phone]                Discharge Exam: Ceasar Mons Weights   09/18/21 1049  Weight: 128.2 kg   General: Alert and oriented x3, no acute distress Cardiovascular: Regular rate and rhythm, S1-S2 Lungs: Clear to auscultation bilaterally  Condition at discharge: good  The results of significant diagnostics from this hospitalization (including imaging, microbiology, ancillary and laboratory) are listed below for reference.   Imaging Studies: DG Chest Portable 1 View  Result Date: 09/18/2021 CLINICAL DATA:  Bradycardia, dizziness, syncope EXAM: PORTABLE CHEST 1 VIEW COMPARISON:  Previous chest radiographs done on 02/18/2017 and CT chest done on 08/16/2021 FINDINGS: Transverse diameter of heart is increased. There is lead in the left side of chest. There are no signs of alveolar pulmonary edema or focal pulmonary consolidation. IMPRESSION:  Cardiomegaly. There are no signs of pulmonary edema or focal pulmonary consolidation. Electronically Signed   By: Ernie Avena M.D.   On: 09/18/2021 11:25    Microbiology: Results for orders placed or performed in visit on 04/21/19  Novel Coronavirus, NAA ([facility])     Status: Abnormal   Collection Time: 04/21/19  1:45 PM   Specimen: Nasopharyngeal(NP) swabs in vial transport medium    NASOPHARYNGE  TESTING  Result Value Ref Range Status   SARS-CoV-2, NAA Detected (A) Not Detected Final    Comment: This nucleic acid amplification test was developed and its performance characteristics determined by [employer]. Nucleic acid amplification tests include PCR and TMA. This test has not been FDA cleared or approved. This test has been authorized by FDA under an Emergency Use Authorization (EUA). This test is only authorized for the duration of time the declaration that circumstances exist justifying the authorization of the emergency use of in vitro diagnostic tests for detection of SARS-CoV-2 virus and/or diagnosis of COVID-19 infection under section 564(b)(1) of the Act, 21 U.S.C. 818EXH-3(Z) (1), unless the authorization is terminated or revoked sooner. When diagnostic testing is negative, the possibility of a false negative result should be considered in the context of a patient's recent exposures and the presence of clinical signs and symptoms consistent with COVID-19. An individual without symptoms of COVID-19 and who is not shedding SARS-CoV-2 virus would  expect to have a negative (not detected) result in this assay.     Labs: CBC: Recent Labs  Lab 09/18/21 1056 09/19/21 0704  WBC 9.5 6.2  NEUTROABS 6.2  --   HGB 14.4 13.9  HCT 44.0 42.3  MCV 92.1 91.4  PLT 352 213   Basic Metabolic Panel: Recent Labs  Lab 09/18/21 1056 09/19/21 0704  NA 140  136 138  K 5.0  6.8* 4.2  CL 109  106 106  CO2 22  24 27   GLUCOSE 101*  183* 106*  BUN 28*  28* 20  CREATININE 1.12  1.20 0.84  CALCIUM 9.1  9.1 8.8*  MG 2.4 2.4  PHOS 3.7 4.3   Liver Function Tests: Recent Labs  Lab 09/18/21 1056 09/19/21 0704  AST 38 44*  ALT 71* 76*  ALKPHOS 50 48  BILITOT 1.1 1.0  PROT 7.2 6.8  ALBUMIN 4.5 4.1   CBG: No results for input(s): GLUCAP in the last 168 hours.  Discharge time spent: less than 30 minutes.  Signed: 09/21/21,  MD [facility] 09/20/2021

## 2021-09-20 NOTE — Assessment & Plan Note (Signed)
Secondary to above.  With correction of bradycardia, no further syncopal or near syncopal events.

## 2021-09-20 NOTE — Assessment & Plan Note (Signed)
Meets criteria with BMI greater than 30 ?

## 2021-09-21 ENCOUNTER — Ambulatory Visit
Admit: 2021-09-21 | Discharge: 2021-09-23 | Disposition: A | Discharge status: Home / Self Care | Payer: MEDICARE | Admitting: Cardiovascular Disease

## 2021-09-21 ENCOUNTER — Encounter
Admit: 2021-09-21 | Discharge: 2021-09-23 | Disposition: A | Discharge status: Home / Self Care | Payer: MEDICARE | Admitting: Cardiovascular Disease

## 2021-09-27 ENCOUNTER — Ambulatory Visit
Admit: 2021-09-27 | Discharge: 2021-09-28 | Payer: MEDICARE | Attending: Nurse Practitioner | Primary: Nurse Practitioner

## 2021-10-01 ENCOUNTER — Telehealth: Payer: Self-pay | Admitting: Internal Medicine

## 2021-10-01 NOTE — Telephone Encounter (Signed)
error 

## 2021-10-09 ENCOUNTER — Ambulatory Visit
Admit: 2021-10-09 | Discharge: 2021-10-10 | Payer: MEDICARE | Attending: Student in an Organized Health Care Education/Training Program | Primary: Student in an Organized Health Care Education/Training Program

## 2021-10-09 DIAGNOSIS — I421 Obstructive hypertrophic cardiomyopathy: Principal | ICD-10-CM

## 2021-10-09 MED ORDER — METOPROLOL TARTRATE 100 MG TABLET
ORAL_TABLET | Freq: Two times a day (BID) | ORAL | 3 refills | 90 days | Status: CP
Start: 2021-10-09 — End: 2022-10-09

## 2021-10-09 MED ORDER — VERAPAMIL ER 240 MG 24 HR CAPSULE,EXTENDED RELEASE
ORAL_CAPSULE | Freq: Two times a day (BID) | ORAL | 3 refills | 0 days | Status: CP
Start: 2021-10-09 — End: 2022-10-09

## 2021-10-12 ENCOUNTER — Ambulatory Visit: Admit: 2021-10-12 | Discharge: 2021-10-12 | Disposition: A | Discharge status: Home / Self Care | Payer: MEDICARE

## 2021-10-12 DIAGNOSIS — R001 Bradycardia, unspecified: Principal | ICD-10-CM

## 2021-10-12 DIAGNOSIS — I421 Obstructive hypertrophic cardiomyopathy: Principal | ICD-10-CM

## 2021-10-12 MED ORDER — METOPROLOL TARTRATE 50 MG TABLET
ORAL_TABLET | Freq: Two times a day (BID) | ORAL | 0 refills | 30 days | Status: CP
Start: 2021-10-12 — End: 2021-11-11

## 2021-10-19 ENCOUNTER — Ambulatory Visit: Admit: 2021-10-19 | Discharge: 2021-10-20 | Payer: MEDICARE

## 2021-10-19 DIAGNOSIS — I421 Obstructive hypertrophic cardiomyopathy: Principal | ICD-10-CM

## 2021-11-16 ENCOUNTER — Ambulatory Visit: Admit: 2021-11-16 | Payer: MEDICARE | Attending: Nurse Practitioner | Primary: Nurse Practitioner

## 2021-11-23 ENCOUNTER — Telehealth
Admit: 2021-11-23 | Discharge: 2021-11-24 | Payer: MEDICARE | Attending: Nurse Practitioner | Primary: Nurse Practitioner

## 2021-11-23 DIAGNOSIS — I421 Obstructive hypertrophic cardiomyopathy: Principal | ICD-10-CM

## 2021-11-23 MED ORDER — FAMOTIDINE 40 MG TABLET
ORAL_TABLET | Freq: Every day | ORAL | 3 refills | 30 days | Status: CP
Start: 2021-11-23 — End: ?

## 2021-11-23 MED ORDER — METOPROLOL TARTRATE 50 MG TABLET
ORAL_TABLET | Freq: Two times a day (BID) | ORAL | 3 refills | 30 days | Status: CP
Start: 2021-11-23 — End: ?

## 2021-11-24 ENCOUNTER — Encounter: Payer: Self-pay | Admitting: Emergency Medicine

## 2021-11-24 ENCOUNTER — Ambulatory Visit
Admission: EM | Admit: 2021-11-24 | Discharge: 2021-11-24 | Disposition: A | Discharge status: Home / Self Care | Payer: Medicare Other

## 2021-11-24 DIAGNOSIS — M7989 Other specified soft tissue disorders: Secondary | ICD-10-CM | POA: Diagnosis not present

## 2021-11-24 DIAGNOSIS — M109 Gout, unspecified: Secondary | ICD-10-CM

## 2021-11-24 HISTORY — DX: Essential (primary) hypertension: I10

## 2021-11-24 HISTORY — DX: Gout, unspecified: M10.9

## 2021-11-24 MED ORDER — COLCHICINE 0.6 MG PO TABS: ORAL_TABLET | ORAL | 0 refills | Status: DC

## 2021-11-24 NOTE — ED Triage Notes (Signed)
Right foot pain that started at great toe and runs up foot x 2 days.  Hx of gout.

## 2021-11-24 NOTE — Discharge Instructions (Addendum)
Take medication as prescribed. Uric acid level has been collected today.  You will be contacted if the results are abnormal.  If you have access to MyChart, you can also see the results there.. Eating a balanced diet can help improve your overall health. It can also help you lose weight, if you are overweight. This includes plenty of fruits, vegetables, whole grains, and low-fat dairy products (labelled "low fat", skim, 2%). Avoid sugar sweetened drinks (including sodas, tea, juice and juice blends, coffee drinks and sports drinks)  Limit alcohol to 1-2 drinks of beer, spirits or wine daily these can make gout flares worse. Follow-up in the emergency department immediately if you develop  severe diarrhea, abnormal sensations, weakness, or abdominal pain. Follow-up with your primary care physician within the next 7 to 10 days for reevaluation.

## 2021-11-24 NOTE — ED Provider Notes (Signed)
[facility]    CSN: [id number] Arrival date & time: 11/24/21  1308      History   Chief Complaint No chief complaint on file.   HPI Christopher Nichols is a 36 y.o. male.   The history is provided by the patient.   The patient is a 36 y.o. male with a history of cardiomyopathy with defibrillator, complains of right great toe pain that began 2 days ago.  Denies a precipitating event or specific injury.  Localizes the pain to the right great toe.  Describes the pain as intermittent and sharp in character.  Has tried OTC medications without relief such as Tylenol.  Symptoms are made worse with weight-bearing.  Reports similar symptoms in the past with gout and treated with colchicine with relief.  Complains of associated swelling, and redness, warm to the touch.  Denies fever, chills, ecchymosis, weakness, numbness and tingling.   Past Medical History:  Diagnosis Date   Cardiac defibrillator in place    Cardiomyopathy [facility])    Gout    Hypertension     Patient Active Problem List   Diagnosis Date Noted   Obesity (BMI 30-39.9) 09/20/2021   Near syncope 09/18/2021   Symptomatic bradycardia    Abnormal CT of the chest 08/17/2021   Acute abdominal pain    Acute duodenitis    Abdominal pain 08/16/2021   ICD (implantable cardioverter-defibrillator) in place 11/01/2020   HOCM (hypertrophic obstructive cardiomyopathy) (HCC) 09/22/2012   GERD 03/05/2007   MALAISE AND FATIGUE 03/05/2007   COUGH 03/05/2007    History reviewed. No pertinent surgical history.     Home Medications    Prior to Admission medications   Medication Sig Start Date End Date Taking? Authorizing Provider  colchicine 0.6 MG tablet Take 2 tablets by mouth today, then take 1 tablet twice daily during the duration of the flare, no longer than 7 days. 11/24/21  Yes Hasani Diemer-Warren, Sadie Haber, NP  metoprolol succinate (TOPROL-XL) 50 MG 24 hr tablet Take 50 mg by mouth daily. Take with or immediately  following a meal.   Yes [provider]  verapamil (CALAN) 40 MG tablet Take 40 mg by mouth 3 (three) times daily.   Yes [provider]    Family History Family History  Problem Relation Age of Onset   Healthy Mother    Healthy Father     Social History Social History   Tobacco Use   Smoking status: Never   Smokeless tobacco: Never  Substance Use Topics   Alcohol use: No   Drug use: No     Allergies   Clarithromycin   Review of Systems Review of Systems Per HPI  Physical Exam Triage Vital Signs ED Triage Vitals  Enc Vitals Group     BP 11/24/21 1318 115/75     Pulse Rate 11/24/21 1318 (!) 59     Resp 11/24/21 1318 18     Temp 11/24/21 1318 97.7 F (36.5 C)     Temp Source 11/24/21 1318 Oral     SpO2 11/24/21 1318 97 %     Weight --      Height --      Head Circumference --      Peak Flow --      Pain Score 11/24/21 1319 9     Pain Loc --      Pain Edu? --      Excl. in GC? --    No data found.  Updated Vital Signs  BP 115/75 (BP Location: Right Arm)   Pulse (!) 59   Temp 97.7 F (36.5 C) (Oral)   Resp 18   SpO2 97%   Visual Acuity Right Eye Distance:   Left Eye Distance:   Bilateral Distance:    Right Eye Near:   Left Eye Near:    Bilateral Near:     Physical Exam Vitals and nursing note reviewed.  Constitutional:      General: He is not in acute distress.    Appearance: Normal appearance.  HENT:     Head: Normocephalic.  Eyes:     Extraocular Movements: Extraocular movements intact.     Conjunctiva/sclera: Conjunctivae normal.     Pupils: Pupils are equal, round, and reactive to light.  Cardiovascular:     Rate and Rhythm: Normal rate and regular rhythm.     Pulses: Normal pulses.     Heart sounds: Normal heart sounds.  Pulmonary:     Effort: Pulmonary effort is normal.     Breath sounds: Normal breath sounds.  Abdominal:     General: Bowel sounds are normal.     Palpations: Abdomen is soft.   Musculoskeletal:     Cervical back: Normal range of motion.     Right foot: Decreased range of motion (1st metatarsal, right great toe). Normal capillary refill. Swelling (1st metatarsal, right great toe) and tenderness (1st metatarsal, right great toe) present. No deformity. Normal pulse.  Feet:     Right foot:     Skin integrity: Erythema (1st metatarsal, right great toe) and warmth (1st metatarsal, right great toe) present.     Toenail Condition: Right toenails are normal.  Skin:    General: Skin is warm and dry.  Neurological:     General: No focal deficit present.     Mental Status: He is alert and oriented to person, place, and time.  Psychiatric:        Mood and Affect: Mood normal.        Behavior: Behavior normal.      UC Treatments / Results  Labs (all labs ordered are listed, but only abnormal results are displayed) Labs Reviewed  URIC ACID    EKG   Radiology No results found.  Procedures Procedures (including critical care time)  Medications Ordered in UC Medications - No data to display  Initial Impression / Assessment and Plan / UC Course  I have reviewed the triage vital signs and the nursing notes.  Pertinent labs & imaging results that were available during my care of the patient were reviewed by me and considered in my medical decision making (see chart for details).  Patient presents for complaints of swelling to the right great toe that has been present for the past 2 days.  Patient has a significant medical history to include history of gout.  There is no known inciting trauma or injury to the right great toe.  On exam, patient has tenderness, swelling, and erythema of the right great toe.  Symptoms appear to be consistent with gout at this time.  Uric acid level is pending.  Patient was started on colchicine for his current symptoms.  Supportive care recommendations were provided to the patient.  Patient advised to follow-up with his PCP for further  evaluation and treatment. Final Clinical Impressions(s) / UC Diagnoses   Final diagnoses:  Swelling of toe of right foot  Gout involving toe of right foot, unspecified cause, unspecified chronicity     Discharge Instructions  Take medication as prescribed. Uric acid level has been collected today.  You will be contacted if the results are abnormal.  If you have access to MyChart, you can also see the results there.. Eating a balanced diet can help improve your overall health. It can also help you lose weight, if you are overweight. This includes plenty of fruits, vegetables, whole grains, and low-fat dairy products (labelled "low fat", skim, 2%). Avoid sugar sweetened drinks (including sodas, tea, juice and juice blends, coffee drinks and sports drinks)  Limit alcohol to 1-2 drinks of beer, spirits or wine daily these can make gout flares worse. Follow-up in the emergency department immediately if you develop  severe diarrhea, abnormal sensations, weakness, or abdominal pain. Follow-up with your primary care physician within the next 7 to 10 days for reevaluation.      ED Prescriptions     Medication Sig Dispense Auth. Provider   colchicine 0.6 MG tablet Take 2 tablets by mouth today, then take 1 tablet twice daily during the duration of the flare, no longer than 7 days. 16 tablet [employer] Demicco-Warren, Sadie Haber, NP      PDMP not reviewed this encounter.   Abran Cantor, NP 11/24/21 1414

## 2021-11-25 LAB — URIC ACID: Uric Acid: 8.8 mg/dL — ABNORMAL HIGH (ref 3.8–8.4)

## 2021-12-21 ENCOUNTER — Ambulatory Visit: Admit: 2021-12-21 | Discharge: 2021-12-21 | Payer: MEDICARE

## 2021-12-21 ENCOUNTER — Institutional Professional Consult (permissible substitution): Admit: 2021-12-21 | Discharge: 2021-12-21 | Payer: MEDICARE

## 2021-12-21 DIAGNOSIS — I421 Obstructive hypertrophic cardiomyopathy: Principal | ICD-10-CM

## 2021-12-21 DIAGNOSIS — Z4502 Encounter for adjustment and management of automatic implantable cardiac defibrillator: Principal | ICD-10-CM

## 2021-12-21 DIAGNOSIS — I319 Disease of pericardium, unspecified: Principal | ICD-10-CM

## 2021-12-25 ENCOUNTER — Ambulatory Visit: Admit: 2021-12-25 | Payer: MEDICARE

## 2022-01-23 ENCOUNTER — Ambulatory Visit: Admit: 2022-01-23 | Discharge: 2022-01-24 | Payer: MEDICARE

## 2022-01-23 ENCOUNTER — Ambulatory Visit
Admit: 2022-01-23 | Discharge: 2022-01-24 | Payer: MEDICARE | Attending: Nurse Practitioner | Primary: Nurse Practitioner

## 2022-01-23 DIAGNOSIS — I421 Obstructive hypertrophic cardiomyopathy: Principal | ICD-10-CM

## 2022-01-23 MED ORDER — CAMZYOS 5 MG CAPSULE
ORAL_CAPSULE | Freq: Every day | ORAL | 0 refills | 0 days | Status: CP
Start: 2022-01-23 — End: ?

## 2022-02-20 ENCOUNTER — Ambulatory Visit
Admit: 2022-02-20 | Discharge: 2022-02-20 | Payer: MEDICARE | Attending: Nurse Practitioner | Primary: Nurse Practitioner

## 2022-02-20 ENCOUNTER — Ambulatory Visit: Admit: 2022-02-20 | Discharge: 2022-02-20 | Payer: MEDICARE

## 2022-02-20 DIAGNOSIS — I421 Obstructive hypertrophic cardiomyopathy: Principal | ICD-10-CM

## 2022-02-20 MED ORDER — MAVACAMTEN 5 MG CAPSULE
ORAL_CAPSULE | Freq: Every day | ORAL | 0 refills | 0 days | Status: CP
Start: 2022-02-20 — End: ?

## 2022-02-20 NOTE — Unmapped (Signed)
[facility] Intake Questionnaire     Since his last visit, Melvin Waters has had an increase of: no cardiac symptoms  Per pt he feels better, no dizziness  Exercise: No yard work  Energy level:good, some days excellent  Recent changes in weight:Yes;  Decrease, lost a couple of pounds    Sexual Health  Sexually active: Yes   Sex drive:excellent   Contraceptive type: None    Routine Care  PCP: Doreatha Massed, MD  Dentist:  no dentist   Last cleaning: no recent cleanings     Mental Health Screening  PHQ-2:0  GAD-2: 0    Vaccination History  COVID vaccine: Fully vaccinated and boosted  Flu vaccine:yes
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
## 2022-02-21 DIAGNOSIS — I421 Obstructive hypertrophic cardiomyopathy: Principal | ICD-10-CM

## 2022-02-21 NOTE — Unmapped (Signed)
[facility] Specialty Medication Onboarding    Specialty Medication: CAMZYOS 5 mg Cap Carmelia Bake)  Prior Authorization: Not Required   Financial Assistance: No - copay  <$25  Final Copay/Day Supply: $10.35 / 30    Insurance Restrictions: None     Notes to Pharmacist:     The triage team has completed the benefits investigation and has determined that the patient is able to fill this medication at [facility]. Please contact the patient to complete the onboarding or follow up with the prescribing physician as needed.

## 2022-02-21 NOTE — Unmapped (Signed)
Opened in error

## 2022-02-26 DIAGNOSIS — I421 Obstructive hypertrophic cardiomyopathy: Principal | ICD-10-CM

## 2022-02-26 MED ORDER — CAMZYOS 5 MG CAPSULE
ORAL_CAPSULE | Freq: Every day | ORAL | 0 refills | 0 days | Status: CP
Start: 2022-02-26 — End: ?

## 2022-03-22 ENCOUNTER — Ambulatory Visit: Admit: 2022-03-22 | Discharge: 2022-03-22 | Payer: MEDICARE

## 2022-03-22 DIAGNOSIS — I421 Obstructive hypertrophic cardiomyopathy: Principal | ICD-10-CM

## 2022-03-22 MED ORDER — METOPROLOL TARTRATE 50 MG TABLET
ORAL_TABLET | Freq: Two times a day (BID) | ORAL | 3 refills | 90.00000 days | Status: CP
Start: 2022-03-22 — End: 2022-03-22

## 2022-03-22 MED ORDER — CAMZYOS 5 MG CAPSULE
ORAL_CAPSULE | Freq: Every day | ORAL | 2 refills | 0 days | Status: CP
Start: 2022-03-22 — End: ?

## 2022-03-22 NOTE — Unmapped (Incomplete)
DIVISION OF CARDIOLOGY  [employer] [place]        Date of Service: 03/22/2022      PCP: Referring Provider:   Doreatha Massed, MD  [address] [facility]  [place] [address]  Phone: [phone]  Fax: [phone] Doreatha Massed, MD  [address]  [facility]  [place],  [place] [address]  Phone: [phone]  Fax: [phone]     ______________________________________________________________________________________________    ASSESSMENT AND PLAN:   1. HOCM (hypertrophic obstructive cardiomyopathy) (CMS-HCC)  - Mavacamten follow-up, week 12  - was having recurrent symptoms off his BB/CCB so doses were increased. Then at higher doses had dizziness and chest pain. Resolved with dose decrease.  - echo shows severe thickening and high gradient >100 mmHg  - will start mevacampten today with echo in 1 month  - stop verapamil while on mevecampten    - consider LHC for invasive hemodynamics and coronary angiography in the future.  - has a SubQ ICD    2. RBBB, 1st degree AV block  -may benefit from pacemaker in the future    Devota Pace, MD  [facility] Internal Medicine - PGY-2    I personally spent 45 minutes face to face and non face to face in the care of this patient, which includes pre, intra, and post visit time on the date of service.    Jacquelyne Balint, MD,  [place], [facility]  Interventional Cardiology  Associate Professor of Medicine  [place] of [facility] at [facility]    ______________________________________________________________________________________________      SUBJECTIVE:     HISTORY OF PRESENT ILLNESS:    I had the pleasure of seeing Melvin Waters in our Adult Congenital Heart Disease Clinic for evaluation of HOCM and consideration for initiation of Mavacamten.      He is a 36 y.o. 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  He has been admitted a few times with chest pain, pericarditis, and presyncope symptoms.  He had his ICD placed on 07/2013 and has done well without any ICD shocks.   He was recently admitted for chest pain due to discontinuation of his nodal blockers.  He is now on BB and CCB - and was recently admitted to [facility] where her sister works and was transferred to [facility] for further evaluation.    His echocardiogram from 07/2021 shows a normal LV function with a resting LVOT gradient of 30 mm Hg and 47 mm Hg with Valsalva.  He has mild mitral regurgitation.  I do not see a cardiac catheterization for invasive hemodynamics but he has has done well all this time with medical management.  His recent admission to [facility] - and upon transfer to [facility], seems to be related to significant bradycardia into the 30s - with lightheadedness. His BB was decreased and he is feeling some better.      We had an initial discussion of whether the ICD can pace him, but it became apparent that he has a SubQ ICD and this type of ICD do not offer intrinsic pacing activity.  I suspect then, that he is having difficulty with the medication dosages - and his current dose of nodal blockers is affecting his AV node - and causing him to be more bradycardic.  The options would be to titrate the nodal blockers down systematically, or keep the BB and add Mavacamten.  Given the  fact that he has had several admissions for chest pain, etc.  Will consider initiating Mavacamten.      Interval events 12/21/21  He has decreased dosages of his BB and CCB. Now taking metoprolol 75mg  twice daily and verapamil 240mg  twice daily. Symptoms much improved at these doses. He is more active and less fatigued. He reports no lightheadedness, dizziness, palpitations, or syncope.  Patient also denies any overt heart failure symptoms such as orthopnea, paroxysmal nocturnal dyspnea, or worsening lower extremity edema.      His echo from 9/1 shows severe thickening and gradient of >134mm Hg. We have decided to initiate low dose mavacampten. He and his sister (caretaker) was counseled on risks of the mevacampten, including heart failure at higher doses. Discussed the need for frequent echos per protocol, he and his sister voiced understanding. He agrees to not taking PPIs. He will stop the verapamil.      Interval events 03/22/22    Melvin Waters states compliance with his medications and denies any untoward side effects from it.     I have reviewed the cardiology tests personally.      PAST MEDICAL HISTORY  Past Medical History:   Diagnosis Date    ADHD (attention deficit hyperactivity disorder)     Hypertension     Hypertrophic cardiomyopathy (CMS-HCC) 2012    with apical predominance and mild SAM. Interventricular septum measures 1.8cm.    Mental retardation        ALLERGIES  Clarithromycin and Nitrous oxide      CURRENT MEDICATIONS  Current Outpatient Medications   Medication Sig Dispense Refill    acetaminophen (TYLENOL) 325 MG tablet Take by mouth every six (6) hours as needed for pain.      cetirizine (ZYRTEC) 10 MG tablet Take 1 tablet (10 mg total) by mouth daily. Taking PRN      famotidine (PEPCID) 40 MG tablet Take 1 tablet (40 mg total) by mouth daily. 30 tablet 3    mavacamten (CAMZYOS) 5 mg cap Take 5 mg by mouth daily. 30 capsule 0    metoprolol tartrate (LOPRESSOR) 50 MG tablet Take 1.5 tablets (75 mg total) by mouth Two (2) times a day. 90 tablet 3    mavacamten 5 mg cap Take one capsule (5 mg) by mouth daily. (Patient not taking: Reported on 03/22/2022) 30 capsule 0     No current facility-administered medications for this visit.       FAMILY HISTORY  Negative for early CAD    SOCIAL HISTORY  He  reports that he has never smoked. He has never used smokeless tobacco. He reports that he does not drink alcohol and does not use drugs.      REVIEW OF SYSTEMS    Review of Systems - 10 systems were reviewed and negative except as noted in HPI    Constitutional: negative for - chills, fatigue, fever or night sweats  ENT ROS: negative for - vertigo or visual changes  Hematological and Lymphatic ROS: negative for - blood transfusions, jaundice, night sweats or swollen lymph nodes  Endocrine ROS: negative for - skin changes or temperature intolerance  Respiratory ROS: no cough, shortness of breath, or wheezing negative for - hemoptysis, orthopnea, shortness of breath, tachypnea or wheezing  Cardiovascular ROS:  As reported in HPI  Gastrointestinal ROS: negative for - abdominal pain, hematemesis, melena, nausea/vomiting or swallowing difficulty/pain  Genito-Urinary ROS: negative for - dysuria, incontinence or nocturia  Musculoskeletal ROS: negative for - gait disturbance,  joint pain, muscle pain or muscular weakness  Neurological ROS: negative for - behavioral changes, bowel and bladder control changes, headaches, seizures or speech problems    PHYSICAL EXAM     Physical Exam  There were no vitals taken for this visit.   Wt Readings from Last 3 Encounters:   02/20/22 (!) 118.8 kg (262 lb)   01/23/22 (!) 120.2 kg (265 lb)   12/21/21 (!) 120.9 kg (266 lb 9.6 oz)       General:  Alert, no distress.   Eyes:  Intact, sclerae anicteric.   Ears, nose, mouth: Moist mucous membranes.Supple, no carotid bruit.    Respiratory:   CTAB bilaterally with normal WOB.   Cardiovascular:  No carotid bruit, no JVD, RRR 2/6 systolic murmur   Gastrointestinal:   Normal bowel sounds, soft, NTND.   Musculoskeletal: Normal strength   Skin: Warm, well perfused.   Neurologic: No focal deficits.       Most recent labs   Lab Results   Component Value Date    Sodium 142 09/23/2021    Sodium 142 11/16/2013    Potassium 4.4 09/23/2021    Potassium 4.6 11/16/2013    Chloride 109 (H) 09/23/2021    Chloride 107 11/16/2013    CO2 24.0 09/23/2021    CO2 25 11/16/2013    BUN 16 09/23/2021    BUN 19 11/16/2013    Creatinine 0.86 09/23/2021    Creatinine 0.90 11/16/2013    Magnesium 1.9 09/23/2021    Magnesium 1.9 09/02/2013     Lab Results   Component Value Date    HGB 14.1 09/23/2021    HGB 14.7 11/16/2013    MCV 89.0 09/23/2021    MCV 89 11/16/2013    Platelet 176 09/23/2021    Platelet 308 11/16/2013     Lab Results   Component Value Date    Cholesterol 143 10/21/2014    Cholesterol, Total 149 12/28/2010    Triglycerides 87 10/21/2014    Triglycerides 189 (H) 12/28/2010    HDL 30 (L) 10/21/2014    HDL 28 (L) 12/28/2010    Non-HDL Cholesterol 113 10/21/2014    LDL Calculated 96 10/21/2014    LDL Direct 103.8 (H) 10/21/2014    LDL Cholesterol, Calculated 83 12/28/2010    Hemoglobin A1C 4.9 12/28/2010    TSH 5.22 (H) 12/28/2010    INR 1.18 09/21/2021    INR 1.0 08/31/2013     *Patient note was created using dragon dictation. Any errors in syntax or proofreading may not have been identified and edited on initial review prior to signing this note.    Thank you very much for allowing me the opportunity to participate in the care of Melvin Waters, who is a delightful patient.  Please do not hesitate to call me if you have any questions.      Scribe Attestation:         This document serves as a record of the services and decisions performed by Jacquelyne Balint, MD, [facility], FSCAI on 03/22/2022. It was created on his behalf by Belva Agee, a trained medical scribe. The creation of this document is based on the provider's statements and observations that were conveyed to the medical scribe during the patient's encounter.     (The information in this document, created by the medical scribe for me, accurately reflects the services I personally performed and the decisions made by me. I have reviewed and approved this document for accuracy.)  Jacquelyne Balint, MD,  [facility], [facility]  Interventional Cardiology  Associate Professor of Medicine  [place] of [place] at [facility]

## 2022-03-22 NOTE — Unmapped (Signed)
Doing well.  Will review echocardiogram

## 2022-03-22 NOTE — Unmapped (Signed)
ACHD Intake Questionnaire     Since his last visit, Melvin Waters has had an increase of: chest discomfort/pain and shortness of breath  Per patient he had some chest discomfort with blowing his nose, he is very congested   Exercise: No   Energy level:good, per patient it is the best it has been in a while, he feels   Congested now so he has less energy with these symptoms  Recent changes in weight:Yes;  Increase, since thanksgiving     Sexual Health  Sexually active: Yes   Sex drive:good   Contraceptive type: None    Routine Care  PCP: Doreatha Massed, MD  Dentist:no dentist  Last cleaning: not since before COVID    Mental Health Screening  PHQ-2:0  GAD-2: 0    Vaccination History  COVID vaccine: Fully vaccinated and boosted  Flu vaccine:yes

## 2022-04-05 NOTE — Unmapped (Signed)
DIVISION OF CARDIOLOGY  [employer] [place] [place]        Date of Service: 03/22/2022      PCP: Referring Provider:   Doreatha Massed, MD  [address] [facility]  [place] [address]  Phone: [phone]  Fax: [phone] Doreatha Massed, MD  [address]  [facility]  [place],  [place] [address]  Phone: [phone]  Fax: [phone]     ______________________________________________________________________________________________    ASSESSMENT AND PLAN:   1. HOCM (hypertrophic obstructive cardiomyopathy) (CMS-HCC)  - was having recurrent symptoms off his BB/CCB so doses were increased. Then at higher doses had dizziness and chest pain. Resolved with BB dose decrease.  - echo shows severe thickening and high gradient >100 mmHg / LVOT gradient on Mavacamten ~ 35 mm Hg, with normal LV function.  - has a SubQ ICD for primary prevention ( no pacing activity)  - overall symptomatically feeling better.  Currently on Mavacamten 5mg  daily and metoprolol 75mg  BID.    I personally spent 45 minutes face to face and non face to face in the care of this patient, which includes pre, intra, and post visit time on the date of service.    Jacquelyne Balint, MD,  [facility], [facility]  Interventional Cardiology  Associate Professor of Medicine  [place] of [facility] at [facility]    ______________________________________________________________________________________________      SUBJECTIVE:     HISTORY OF PRESENT ILLNESS:    I had the pleasure of seeing Melvin Waters in our Adult Congenital Heart Disease Clinic for evaluation of HOCM and consideration for initiation of Mavacamten.      He is a 36 y.o. 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  He has been admitted a few times with chest pain, pericarditis, and presyncope symptoms.  He had his ICD placed on 07/2013 and has done well without any ICD shocks.   He was recently admitted for chest pain due to discontinuation of his nodal blockers.  He is now on BB and CCB - and was recently admitted to [facility] where her sister works and was transferred to [facility] for further evaluation.    His echocardiogram from 07/2021 shows a normal LV function with a resting LVOT gradient of 30 mm Hg and 47 mm Hg with Valsalva.  He has mild mitral regurgitation.  I do not see a cardiac catheterization for invasive hemodynamics but he has has done well all this time with medical management.  His recent admission to [facility] - and upon transfer to [facility], seems to be related to significant bradycardia into the 30s - with lightheadedness. His BB was decreased and he is feeling some better.      We had an initial discussion of whether the ICD can pace him, but it became apparent that he has a SubQ ICD and this type of ICD do not offer intrinsic pacing activity.  I suspect then, that he is having difficulty with the medication dosages - and his current dose of nodal blockers is affecting his AV node - and causing him to be more bradycardic.  The options would be to titrate the nodal blockers down systematically, or keep the BB and add Mavacamten.  Given the fact that he has had several admissions for chest pain, etc.  Will consider initiating Mavacamten.      Interval events 12/21/21  He has decreased dosages of his  BB and CCB. Now taking metoprolol 75mg  twice daily and verapamil 240mg  twice daily. Symptoms much improved at these doses. He is more active and less fatigued. He reports no lightheadedness, dizziness, palpitations, or syncope.  Patient also denies any overt heart failure symptoms such as orthopnea, paroxysmal nocturnal dyspnea, or worsening lower extremity edema.      His echo from 9/1 shows severe thickening and gradient of >168mm Hg. We have decided to initiate low dose mavacampten. He and his sister (caretaker) was counseled on risks of the mevacampten, including heart failure at higher doses. Discussed the need for frequent echos per protocol, he and his sister voiced understanding. He agrees to not taking PPIs. He will stop the verapamil.      Interval events 03/22/22    Melvin Waters states compliance with his medications and denies any untoward side effects from it. He is feeling well.  He is able to walk long distances, work and hunt without dyspnea.      I have reviewed the cardiology tests personally.      PAST MEDICAL HISTORY  Past Medical History:   Diagnosis Date    ADHD (attention deficit hyperactivity disorder)     Hypertension     Hypertrophic cardiomyopathy (CMS-HCC) 2012    with apical predominance and mild SAM. Interventricular septum measures 1.8cm.    Mental retardation        ALLERGIES  Clarithromycin and Nitrous oxide      CURRENT MEDICATIONS  Current Outpatient Medications   Medication Sig Dispense Refill    acetaminophen (TYLENOL) 325 MG tablet Take by mouth every six (6) hours as needed for pain.      cetirizine (ZYRTEC) 10 MG tablet Take 1 tablet (10 mg total) by mouth daily. Taking PRN      famotidine (PEPCID) 40 MG tablet Take 1 tablet (40 mg total) by mouth daily. 30 tablet 3    mavacamten (CAMZYOS) 5 mg cap Take 5 mg by mouth daily. 30 capsule 2    metoPROLOL tartrate (LOPRESSOR) 50 MG tablet Take 1.5 tablets (75 mg total) by mouth two (2) times a day. 270 tablet 3     No current facility-administered medications for this visit.       FAMILY HISTORY  Negative for early CAD    SOCIAL HISTORY  He  reports that he has never smoked. He has never used smokeless tobacco. He reports that he does not drink alcohol and does not use drugs.      REVIEW OF SYSTEMS    Review of Systems - 10 systems were reviewed and negative except as noted in HPI    Constitutional: negative for - chills, fatigue, fever or night sweats  ENT ROS: negative for - vertigo or visual changes  Hematological and Lymphatic ROS: negative for - blood transfusions, jaundice, night sweats or swollen lymph nodes  Endocrine ROS: negative for - skin changes or temperature intolerance  Respiratory ROS: no cough, shortness of breath, or wheezing negative for - hemoptysis, orthopnea, shortness of breath, tachypnea or wheezing  Cardiovascular ROS:  As reported in HPI  Gastrointestinal ROS: negative for - abdominal pain, hematemesis, melena, nausea/vomiting or swallowing difficulty/pain  Genito-Urinary ROS: negative for - dysuria, incontinence or nocturia  Musculoskeletal ROS: negative for - gait disturbance, joint pain, muscle pain or muscular weakness  Neurological ROS: negative for - behavioral changes, bowel and bladder control changes, headaches, seizures or speech problems    PHYSICAL EXAM     Physical Exam  BP 122/79 (BP Site: L Arm, BP Position: Sitting)  - Pulse 74  - Ht 182.9 cm (6')  - Wt (!) 121.1 kg (267 lb)  - BMI 36.21 kg/m??    Wt Readings from Last 3 Encounters:   03/22/22 (!) 121.1 kg (267 lb)   02/20/22 (!) 118.8 kg (262 lb)   01/23/22 (!) 120.2 kg (265 lb)       General:  Alert, no distress.   Eyes:  Intact, sclerae anicteric.   Ears, nose, mouth: Moist mucous membranes.Supple, no carotid bruit.    Respiratory:   CTAB bilaterally with normal WOB.   Cardiovascular:  No carotid bruit, no JVD, RRR 2/6 systolic murmur   Gastrointestinal:   Normal bowel sounds, soft, NTND.   Musculoskeletal: Normal strength   Skin: Warm, well perfused.   Neurologic: No focal deficits.       Most recent labs   Lab Results   Component Value Date    Sodium 142 09/23/2021    Sodium 142 11/16/2013    Potassium 4.4 09/23/2021    Potassium 4.6 11/16/2013    Chloride 109 (H) 09/23/2021    Chloride 107 11/16/2013    CO2 24.0 09/23/2021    CO2 25 11/16/2013    BUN 16 09/23/2021    BUN 19 11/16/2013    Creatinine 0.86 09/23/2021    Creatinine 0.90 11/16/2013    Magnesium 1.9 09/23/2021    Magnesium 1.9 09/02/2013     Lab Results   Component Value Date    HGB 14.1 09/23/2021    HGB 14.7 11/16/2013    MCV 89.0 09/23/2021    MCV 89 11/16/2013    Platelet 176 09/23/2021    Platelet 308 11/16/2013     Lab Results   Component Value Date    Cholesterol 143 10/21/2014    Cholesterol, Total 149 12/28/2010    Triglycerides 87 10/21/2014    Triglycerides 189 (H) 12/28/2010    HDL 30 (L) 10/21/2014    HDL 28 (L) 12/28/2010    Non-HDL Cholesterol 113 10/21/2014    LDL Calculated 96 10/21/2014    LDL Direct 103.8 (H) 10/21/2014    LDL Cholesterol, Calculated 83 12/28/2010    Hemoglobin A1C 4.9 12/28/2010    TSH 5.22 (H) 12/28/2010    INR 1.18 09/21/2021    INR 1.0 08/31/2013     *Patient note was created using dragon dictation. Any errors in syntax or proofreading may not have been identified and edited on initial review prior to signing this note.    Thank you very much for allowing me the opportunity to participate in the care of Melvin Waters, who is a delightful patient.  Please do not hesitate to call me if you have any questions.      Scribe Attestation:         This document serves as a record of the services and decisions performed by Jacquelyne Balint, MD, [facility], FSCAI on 03/22/2022. It was created on his behalf by Belva Agee, a trained medical scribe. The creation of this document is based on the provider's statements and observations that were conveyed to the medical scribe during the patient's encounter.     (The information in this document, created by the medical scribe for me, accurately reflects the services I personally performed and the decisions made by me. I have reviewed and approved this document for accuracy.)     Jacquelyne Balint, MD,  [facility], [facility]  Interventional Cardiology  Associate Professor  of Medicine  [place] [facility] [place] at [facility]

## 2022-04-26 DIAGNOSIS — I421 Obstructive hypertrophic cardiomyopathy: Principal | ICD-10-CM

## 2022-06-05 ENCOUNTER — Ambulatory Visit: Admit: 2022-06-05 | Discharge: 2022-06-05 | Payer: MEDICARE

## 2022-06-05 ENCOUNTER — Ambulatory Visit
Admit: 2022-06-05 | Discharge: 2022-06-05 | Payer: MEDICARE | Attending: Nurse Practitioner | Primary: Nurse Practitioner

## 2022-06-05 ENCOUNTER — Institutional Professional Consult (permissible substitution): Admit: 2022-06-05 | Discharge: 2022-06-05 | Payer: MEDICARE

## 2022-06-05 DIAGNOSIS — Z4502 Encounter for adjustment and management of automatic implantable cardiac defibrillator: Principal | ICD-10-CM

## 2022-06-05 MED ORDER — METOPROLOL TARTRATE 50 MG TABLET
ORAL_TABLET | Freq: Two times a day (BID) | ORAL | 3 refills | 135 days | Status: CP
Start: 2022-06-05 — End: ?

## 2022-06-05 NOTE — Unmapped (Signed)
You were seen in the Adult Congenital Heart Clinic today at [facility] Cardiology at [facility].    TODAY:  - Drink more water.   - Your echo looked a little worse, but we'll keep you at the same dose.  - Your device was checked today.      NEXT APPOINTMENT:  Return to clinic in 3 months with Jacquelyne Balint, MD.     Call the clinic at [phone] with questions.  Our clinic fax number is [phone].  If you need to reschedule future appointments, please call [phone] or [phone]    Our [facility] nurse and patient coordinator, Dorcas Carrow, can be reached at is [phone] if you need further assistance.    After office hours, if you have urgent questions/problems, contact the on-call cardiologist through the hospital operator: [phone].

## 2022-06-05 NOTE — Unmapped (Signed)
ACHD Intake Questionnaire     Since his last visit, Melvin Waters has had an increase of: no cardiac symptoms  Exercise: No   Energy level:good, slightly better, pt is able to walk miles when hunting, does get SOB after several miles  Recent changes in weight:No    Sexual Health  Sexually active: Yes   Sex drive:good   Contraceptive type: None    Routine Care  PCP: Doreatha Massed, MD  Dentist:no dentist  Last cleaning: not since before COVID    Mental Health Screening  PHQ-2:0  GAD-2: 0    Vaccination History  COVID vaccine: Fully vaccinated and boosted  Flu vaccine:yes

## 2022-06-07 MED ORDER — CAMZYOS 5 MG CAPSULE
ORAL_CAPSULE | Freq: Every day | ORAL | 2 refills | 0 days | Status: CP
Start: 2022-06-07 — End: ?
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
## 2022-06-13 DIAGNOSIS — I421 Obstructive hypertrophic cardiomyopathy: Principal | ICD-10-CM

## 2022-08-02 NOTE — Unmapped (Signed)
Specialty Medication(s): Camzyos    Melvin Waters has been dis-enrolled from the [facility] specialty pharmacy services due to a pharmacy change. The patient is now filling at [facility] .    Additional information provided to the patient: n/a    Camillo Flaming, PharmD  [facility] Specialty Pharmacist

## 2022-08-28 ENCOUNTER — Ambulatory Visit
Admit: 2022-08-28 | Discharge: 2022-08-28 | Payer: MEDICARE | Attending: Nurse Practitioner | Primary: Nurse Practitioner

## 2022-08-28 ENCOUNTER — Ambulatory Visit: Admit: 2022-08-28 | Discharge: 2022-08-28 | Payer: MEDICARE

## 2022-09-04 MED ORDER — CAMZYOS 5 MG CAPSULE
ORAL_CAPSULE | Freq: Every day | ORAL | 2 refills | 0 days | Status: CP
Start: 2022-09-04 — End: ?

## 2022-09-10 ENCOUNTER — Other Ambulatory Visit (HOSPITAL_COMMUNITY)
Admission: RE | Admit: 2022-09-10 | Discharge: 2022-09-10 | Disposition: A | Discharge status: Home / Self Care | Payer: Medicare Other | Source: Ambulatory Visit | Attending: Family Medicine | Admitting: Family Medicine

## 2022-09-10 DIAGNOSIS — Z79899 Other long term (current) drug therapy: Secondary | ICD-10-CM | POA: Insufficient documentation

## 2022-09-10 DIAGNOSIS — Z0000 Encounter for general adult medical examination without abnormal findings: Secondary | ICD-10-CM | POA: Diagnosis present

## 2022-09-10 LAB — COMPREHENSIVE METABOLIC PANEL
ALT: 85 U/L — ABNORMAL HIGH (ref 0–44)
AST: 52 U/L — ABNORMAL HIGH (ref 15–41)
Albumin: 4.5 g/dL (ref 3.5–5.0)
Alkaline Phosphatase: 50 U/L (ref 38–126)
Anion gap: 8 (ref 5–15)
BUN: 22 mg/dL — ABNORMAL HIGH (ref 6–20)
CO2: 25 mmol/L (ref 22–32)
Calcium: 8.9 mg/dL (ref 8.9–10.3)
Chloride: 103 mmol/L (ref 98–111)
Creatinine, Ser: 1 mg/dL (ref 0.61–1.24)
GFR, Estimated: >60 mL/min (ref >60)
Glucose, Bld: 86 mg/dL (ref 70–99)
Potassium: 3.9 mmol/L (ref 3.5–5.1)
Sodium: 136 mmol/L (ref 135–145)
Total Bilirubin: 1.1 mg/dL (ref 0.3–1.2)
Total Protein: 7.3 g/dL (ref 6.5–8.1)

## 2022-09-10 LAB — LIPID PANEL
Cholesterol: 157 mg/dL (ref 0–200)
HDL: 30 mg/dL — ABNORMAL LOW (ref >40)
LDL Cholesterol: 110 mg/dL — ABNORMAL HIGH (ref 0–99)
Total CHOL/HDL Ratio: 5.2 RATIO
Triglycerides: 83 mg/dL (ref <150)
VLDL: 17 mg/dL (ref 0–40)

## 2022-09-10 LAB — CBC WITH DIFFERENTIAL/PLATELET
Abs Immature Granulocytes: 0.02 10*3/uL (ref 0.00–0.07)
Basophils Absolute: 0 10*3/uL (ref 0.0–0.1)
Basophils Relative: 0 %
Eosinophils Absolute: 0.1 10*3/uL (ref 0.0–0.5)
Eosinophils Relative: 1 %
HCT: 40.3 % (ref 39.0–52.0)
Hemoglobin: 13.7 g/dL (ref 13.0–17.0)
Immature Granulocytes: 0 %
Lymphocytes Relative: 41 %
Lymphs Abs: 2 10*3/uL (ref 0.7–4.0)
MCH: 30.6 pg (ref 26.0–34.0)
MCHC: 34 g/dL (ref 30.0–36.0)
MCV: 90.2 fL (ref 80.0–100.0)
Monocytes Absolute: 0.4 10*3/uL (ref 0.1–1.0)
Monocytes Relative: 8 %
Neutro Abs: 2.4 10*3/uL (ref 1.7–7.7)
Neutrophils Relative %: 50 %
Platelets: 204 10*3/uL (ref 150–400)
RBC: 4.47 MIL/uL (ref 4.22–5.81)
RDW: 13 % (ref 11.5–15.5)
WBC: 5 10*3/uL (ref 4.0–10.5)
nRBC: 0 % (ref 0.0–0.2)

## 2022-09-10 LAB — TSH: TSH: 1.775 u[IU]/mL (ref 0.350–4.500)

## 2022-11-02 ENCOUNTER — Emergency Department: Payer: Medicare Other

## 2022-11-02 ENCOUNTER — Other Ambulatory Visit: Payer: Self-pay

## 2022-11-02 ENCOUNTER — Emergency Department
Admission: EM | Admit: 2022-11-02 | Discharge: 2022-11-02 | Disposition: A | Discharge status: Home / Self Care | Payer: Medicare Other | Attending: Emergency Medicine | Admitting: Emergency Medicine

## 2022-11-02 DIAGNOSIS — M222X2 Patellofemoral disorders, left knee: Secondary | ICD-10-CM | POA: Insufficient documentation

## 2022-11-02 DIAGNOSIS — M25562 Pain in left knee: Secondary | ICD-10-CM | POA: Diagnosis present

## 2022-11-02 MED ORDER — KETOROLAC TROMETHAMINE 30 MG/ML IJ SOLN
30.0000 mg | Freq: Once | INTRAMUSCULAR | Status: AC
Start: 2022-11-02 — End: 2022-11-02
  Administered 2022-11-02: 30 mg via INTRAMUSCULAR
  Filled 2022-11-02: qty 1

## 2022-11-02 MED ORDER — MELOXICAM 15 MG PO TABS: 15.0000 mg | ORAL_TABLET | Freq: Every day | ORAL | 0 refills | Status: AC

## 2022-11-02 NOTE — ED Notes (Signed)
Pt denies known mechanism of injury; LEFT knee pain; sharp pain/tenderness per pt; per pt movement helps it some though it still hurts while walking; reports hurts the worst when sitting still; hurts around patella; LEFT knee slightly warmer than R. No obvious redness or swelling currently noted. Pt steady when walking to room; no limp noted. Pt in NAD. Pt able to move legs appropriately.

## 2022-11-02 NOTE — ED Triage Notes (Signed)
Atraumatic LEFT knee pain that began 3 days ago; Pain worsens with movement, does get relief when sitting down

## 2022-11-02 NOTE — ED Provider Notes (Signed)
[facility] Provider Note  Patient Contact: 10:19 PM (approximate)   History   Knee Pain (Atraumatic LEFT knee pain that began 3 days ago; Pain worsens with movement, does get relief when sitting down)   HPI  Christopher Nichols is a 37 y.o. male who presents the emergency department complaining of atraumatic knee pain.  States pain began 3 days ago pain to the knee.  No trauma.  No history of knee injuries.  Patient denies any history of gout.  No other complaints at this time.  Patient denies any calf or thigh pain.     Physical Exam   Triage Vital Signs: ED Triage Vitals  Encounter Vitals Group     BP 11/02/22 1929 (!) 151/83     Systolic BP Percentile --      Diastolic BP Percentile --      Pulse Rate 11/02/22 1929 89     Resp 11/02/22 1929 19     Temp 11/02/22 1929 98.5 F (36.9 C)     Temp src --      SpO2 11/02/22 1929 100 %     Weight 11/02/22 1932 260 lb (117.9 kg)     Height 11/02/22 1932 6' (1.829 m)     Head Circumference --      Peak Flow --      Pain Score 11/02/22 1931 9     Pain Loc --      Pain Education --      Exclude from Growth Chart --     Most recent vital signs: Vitals:   11/02/22 1929 11/02/22 2044  BP: (!) 151/83 (!) 147/82  Pulse: 89 82  Resp: 19 18  Temp: 98.5 F (36.9 C)   SpO2: 100% 98%     General: Alert and in no acute distress.  Cardiovascular:  Good peripheral perfusion Respiratory: Normal respiratory effort without tachypnea or retractions. Lungs CTAB. Musculoskeletal: Full range of motion to all extremities.  Visualization of the left knee reveals no gross edema, erythema.  No warmth to palpation.  Full range of motion is preserved.  Patient is still amatory in the knee.  Tender over the patella but states that he feels it behind the patella with palpation.  No other tenderness to palpation.  Pulses sensation intact distally. Neurologic:  No gross focal neurologic deficits are appreciated.  Skin:    No rash noted Other:   ED Results / Procedures / Treatments   Labs (all labs ordered are listed, but only abnormal results are displayed) Labs Reviewed - No data to display   EKG     RADIOLOGY  I personally viewed, evaluated, and interpreted these images as part of my medical decision making, as well as reviewing the written report by the radiologist.  ED Provider Interpretation: No acute traumatic findings or significant edema about the knee are appreciated on x-ray.  DG Knee Complete 4 Views Left  Result Date: 11/02/2022 CLINICAL DATA:  Left knee pain EXAM: LEFT KNEE - COMPLETE 4+ VIEW COMPARISON:  None Available. FINDINGS: No evidence of fracture, dislocation, or joint effusion. No evidence of arthropathy or other focal bone abnormality. Soft tissues are unremarkable. IMPRESSION: Negative. Electronically Signed   By: Helyn Numbers M.D.   On: 11/02/2022 20:18    PROCEDURES:  Critical Care performed: No  Procedures   MEDICATIONS ORDERED IN ED: Medications  ketorolac (TORADOL) 30 MG/ML injection 30 mg (has no administration in time range)     IMPRESSION / MDM /  ASSESSMENT AND PLAN / ED COURSE  I reviewed the triage vital signs and the nursing notes.                                 Differential diagnosis includes, but is not limited to, knee sprain, patellofemoral syndrome, arthritis of the knee, gout, septic arthritis, DVT   Patient's presentation is most consistent with acute presentation with potential threat to life or bodily function.   Patient's diagnosis is consistent with patellofemoral syndrome.  Patient presents the emergency department pain behind the kneecap.  Tender on palpation to the knee But felt behind the patella with palpation.  No concern for gout, septic arthritis or DVT on exam.  X-ray was reassuring.  Anti-inflammatory prescribed for the patient.  Follow-up primary care or orthopedics as needed.. Patient is given ED precautions to return to  the ED for any worsening or new symptoms.     FINAL CLINICAL IMPRESSION(S) / ED DIAGNOSES   Final diagnoses:  Patellofemoral pain syndrome of left knee     Rx / DC Orders   ED Discharge Orders          Ordered    meloxicam (MOBIC) 15 MG tablet  Daily        11/02/22 2228             Note:  This document was prepared using Dragon voice recognition software and may include unintentional dictation errors.   Lanette Hampshire 11/02/22 2229    Dionne Bucy, MD 11/03/22 1109

## 2022-11-02 NOTE — ED Notes (Signed)
Pt as for a cup of water to take his heart medicine. Provided

## 2022-11-20 ENCOUNTER — Institutional Professional Consult (permissible substitution): Admit: 2022-11-20 | Discharge: 2022-11-20 | Payer: MEDICARE

## 2022-11-20 ENCOUNTER — Ambulatory Visit: Admit: 2022-11-20 | Discharge: 2022-11-20 | Payer: MEDICARE

## 2022-11-20 ENCOUNTER — Ambulatory Visit
Admit: 2022-11-20 | Discharge: 2022-11-20 | Payer: MEDICARE | Attending: Nurse Practitioner | Primary: Nurse Practitioner

## 2022-11-20 DIAGNOSIS — I421 Obstructive hypertrophic cardiomyopathy: Principal | ICD-10-CM

## 2022-11-20 DIAGNOSIS — Z95810 Presence of automatic (implantable) cardiac defibrillator: Principal | ICD-10-CM

## 2022-11-20 DIAGNOSIS — Z0000 Encounter for general adult medical examination without abnormal findings: Principal | ICD-10-CM

## 2022-11-25 MED ORDER — CAMZYOS 5 MG CAPSULE
ORAL_CAPSULE | Freq: Every day | ORAL | 2 refills | 0 days | Status: CP
Start: 2022-11-25 — End: ?

## 2022-12-02 ENCOUNTER — Ambulatory Visit: Admit: 2022-12-02 | Discharge: 2022-12-03 | Payer: MEDICARE

## 2022-12-02 DIAGNOSIS — K760 Fatty (change of) liver, not elsewhere classified: Principal | ICD-10-CM

## 2022-12-02 DIAGNOSIS — Z4502 Encounter for adjustment and management of automatic implantable cardiac defibrillator: Principal | ICD-10-CM

## 2022-12-02 DIAGNOSIS — Z95810 Presence of automatic (implantable) cardiac defibrillator: Principal | ICD-10-CM

## 2022-12-02 DIAGNOSIS — I319 Disease of pericardium, unspecified: Principal | ICD-10-CM

## 2022-12-02 DIAGNOSIS — K219 Gastro-esophageal reflux disease without esophagitis: Principal | ICD-10-CM

## 2022-12-02 DIAGNOSIS — M109 Gout, unspecified: Principal | ICD-10-CM

## 2022-12-02 DIAGNOSIS — Z131 Encounter for screening for diabetes mellitus: Principal | ICD-10-CM

## 2022-12-02 DIAGNOSIS — I421 Obstructive hypertrophic cardiomyopathy: Principal | ICD-10-CM

## 2022-12-02 DIAGNOSIS — Z0000 Encounter for general adult medical examination without abnormal findings: Principal | ICD-10-CM

## 2023-02-12 ENCOUNTER — Ambulatory Visit
Admission: EM | Admit: 2023-02-12 | Discharge: 2023-02-12 | Disposition: A | Discharge status: Home / Self Care | Payer: Medicare Other

## 2023-02-12 DIAGNOSIS — R051 Acute cough: Secondary | ICD-10-CM

## 2023-02-12 DIAGNOSIS — B349 Viral infection, unspecified: Secondary | ICD-10-CM

## 2023-02-12 MED ORDER — PROMETHAZINE-DM 6.25-15 MG/5ML PO SYRP: 5.0000 mL | ORAL_SOLUTION | Freq: Four times a day (QID) | ORAL | 0 refills | Status: DC | PRN

## 2023-02-12 MED ORDER — BENZONATATE 100 MG PO CAPS: 100.0000 mg | ORAL_CAPSULE | Freq: Three times a day (TID) | ORAL | 0 refills | Status: DC | PRN

## 2023-02-12 NOTE — ED Triage Notes (Signed)
Patient to Urgent Care with complaints of cough/ fevers. Max temp 102.   Symptoms started four days ago. Reports lack of sleep.  Taking HBP cold and flu meds.

## 2023-02-12 NOTE — Discharge Instructions (Addendum)
Follow up with your primary care provider tomorrow.  Go to the emergency department if you have worsening symptoms.    Take the Tessalon Perles or Promethazine DM as directed for cough.

## 2023-02-12 NOTE — ED Provider Notes (Signed)
[facility]    CSN: [id number] Arrival date & time: 02/12/23  0830      History   Chief Complaint Chief Complaint  Patient presents with   Cough   Fever    HPI Christopher Nichols is a 37 y.o. male.  Patient presents with 4-day history of fever and cough.  Tmax 102 last night.  No OTC medications taken today.  His cough is nonproductive but is keeping him awake at night.  He denies shortness of breath, chest pain, or other symptoms.  His medical history includes hypertension, cardiomyopathy, cardiac defibrillator, gout, GERD.  The history is provided by the patient and medical records.    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 No pertinent surgical history.     Home Medications    Prior to Admission medications   Medication Sig Start Date End Date Taking? Authorizing Provider  allopurinol (ZYLOPRIM) 100 MG tablet Take 100 mg by mouth daily. 11/05/22  Yes [provider]  benzonatate (TESSALON) 100 MG capsule Take 1 capsule (100 mg total) by mouth 3 (three) times daily as needed for cough. 02/12/23  Yes Mickie Bail, NP  promethazine-dextromethorphan (PROMETHAZINE-DM) 6.25-15 MG/5ML syrup Take 5 mLs by mouth 4 (four) times daily as needed. 02/12/23  Yes Mickie Bail, NP  CAMZYOS 5 MG CAPS capsule Take 5 mg by mouth daily.    [provider]  colchicine 0.6 MG tablet Take 2 tablets  by mouth today, then take 1 tablet twice daily during the duration of the flare, no longer than 7 days. 11/24/21   Leath-Warren, Sadie Haber, NP  meloxicam (MOBIC) 15 MG tablet Take 1 tablet (15 mg total) by mouth daily. 11/02/22 11/02/23  Cuthriell, Delorise Royals, PA-C  metoprolol succinate (TOPROL-XL) 50 MG 24 hr tablet Take 50 mg by mouth daily. Take with or immediately following a meal.    [provider]  verapamil (CALAN) 40 MG tablet Take 40 mg by mouth 3 (three) times daily.    [provider]    Family History Family History  Problem Relation Age of Onset   Healthy Mother    Healthy Father     Social History Social History   Tobacco Use   Smoking status: Never   Smokeless tobacco: Never  Substance Use Topics   Alcohol use: No   Drug use: No     Allergies   Clarithromycin   Review of Systems Review of Systems  Constitutional:  Positive for fever. Negative for chills.  HENT:  Negative for ear pain and sore throat.   Respiratory:  Positive for cough. Negative for shortness of breath.   Cardiovascular:  Negative for chest pain and palpitations.  Gastrointestinal:  Negative for diarrhea and vomiting.     Physical Exam Triage Vital Signs ED Triage Vitals  Encounter Vitals Group     BP 02/12/23 0843  112/79     Systolic BP Percentile --      Diastolic BP Percentile --      Pulse Rate 02/12/23 0843 85     Resp 02/12/23 0843 18     Temp 02/12/23 0843 98.3 F (36.8 C)     Temp src --      SpO2 02/12/23 0843 95 %     Weight --      Height --      Head Circumference --      Peak Flow --      Pain Score 02/12/23 0837 0     Pain Loc --      Pain Education --      Exclude from Growth Chart --    No data found.  Updated Vital Signs BP 112/79   Pulse 85   Temp 98.3 F (36.8 C)   Resp 18   SpO2 95%   Visual Acuity Right Eye Distance:   Left Eye Distance:   Bilateral Distance:    Right Eye Near:   Left Eye Near:    Bilateral Near:      Physical Exam Vitals and nursing note reviewed.  Constitutional:      General: He is not in acute distress.    Appearance: He is well-developed.  HENT:     Right Ear: Tympanic membrane normal.     Left Ear: Tympanic membrane normal.     Nose: Nose normal.     Mouth/Throat:     Mouth: Mucous membranes are moist.     Pharynx: Oropharynx is clear.  Cardiovascular:     Rate and Rhythm: Normal rate and regular rhythm.     Heart sounds: Normal heart sounds.  Pulmonary:     Effort: Pulmonary effort is normal. No respiratory distress.     Breath sounds: Normal breath sounds.  Musculoskeletal:     Cervical back: Neck supple.  Skin:    General: Skin is warm and dry.  Neurological:     Mental Status: He is alert.      UC Treatments / Results  Labs (all labs ordered are listed, but only abnormal results are displayed) Labs Reviewed - No data to display  EKG   Radiology No results found.  Procedures Procedures (including critical care time)  Medications Ordered in UC Medications - No data to display  Initial Impression / Assessment and Plan / UC Course  I have reviewed the triage vital signs and the nursing notes.  Pertinent labs & imaging results that were available during my care of the patient were reviewed by me and considered in my medical decision making (see chart for details).    Viral illness with cough.  Lungs are clear and O2 sat is 95% on room air.  No respiratory distress.  He is afebrile without the use of antipyretics.  Patient declines COVID test.  No flu test done today as patient is outside the window for treatment.   His primary concern today is the cough keeping him awake at night.  Treating today with Tessalon Perles or Promethazine DM.  Precautions for drowsiness with promethazine discussed.  Instructed patient to follow-up with his PCP tomorrow.  ED precautions given.  Education provided on cough and viral illness.  He agrees to plan of care. Final  Clinical Impressions(s) / UC Diagnoses   Final diagnoses:  Viral illness  Acute cough     Discharge Instructions      Follow up with your primary  care provider tomorrow.  Go to the emergency department if you have worsening symptoms.    Take the Tessalon Perles or Promethazine DM as directed for cough.     ED Prescriptions     Medication Sig Dispense Auth. Provider   benzonatate (TESSALON) 100 MG capsule Take 1 capsule (100 mg total) by mouth 3 (three) times daily as needed for cough. 21 capsule Mickie Bail, NP   promethazine-dextromethorphan (PROMETHAZINE-DM) 6.25-15 MG/5ML syrup Take 5 mLs by mouth 4 (four) times daily as needed. 118 mL Mickie Bail, NP      PDMP not reviewed this encounter.   Mickie Bail, NP 02/12/23 [id number]

## 2023-02-19 ENCOUNTER — Ambulatory Visit: Admit: 2023-02-19 | Discharge: 2023-02-20 | Payer: MEDICARE

## 2023-02-19 ENCOUNTER — Institutional Professional Consult (permissible substitution)
Admit: 2023-02-19 | Discharge: 2023-02-20 | Payer: MEDICARE | Attending: Nurse Practitioner | Primary: Nurse Practitioner

## 2023-02-19 ENCOUNTER — Ambulatory Visit
Admit: 2023-02-19 | Discharge: 2023-02-20 | Payer: MEDICARE | Attending: Nurse Practitioner | Primary: Nurse Practitioner

## 2023-02-19 DIAGNOSIS — Z95810 Presence of automatic (implantable) cardiac defibrillator: Principal | ICD-10-CM

## 2023-02-19 DIAGNOSIS — Z4502 Encounter for adjustment and management of automatic implantable cardiac defibrillator: Principal | ICD-10-CM

## 2023-02-19 DIAGNOSIS — I421 Obstructive hypertrophic cardiomyopathy: Principal | ICD-10-CM

## 2023-03-14 MED ORDER — CAMZYOS 5 MG CAPSULE
ORAL_CAPSULE | Freq: Every day | ORAL | 2 refills | 0 days | Status: CP
Start: 2023-03-14 — End: ?

## 2023-04-28 MED ORDER — FAMOTIDINE 40 MG TABLET
ORAL_TABLET | Freq: Every day | ORAL | 3 refills | 30.00 days | Status: CP
Start: 2023-04-28 — End: ?

## 2023-05-16 ENCOUNTER — Inpatient Hospital Stay: Admit: 2023-05-16 | Discharge: 2023-05-17 | Payer: MEDICARE

## 2023-05-16 ENCOUNTER — Ambulatory Visit: Admit: 2023-05-16 | Discharge: 2023-05-17 | Payer: MEDICARE

## 2023-05-16 DIAGNOSIS — I421 Obstructive hypertrophic cardiomyopathy: Principal | ICD-10-CM

## 2023-05-16 MED ORDER — CAMZYOS 5 MG CAPSULE
ORAL_CAPSULE | Freq: Every day | ORAL | 2 refills | 0.00 days | Status: CP
Start: 2023-05-16 — End: ?

## 2023-07-30 DIAGNOSIS — I421 Obstructive hypertrophic cardiomyopathy: Principal | ICD-10-CM

## 2023-07-30 MED ORDER — CAMZYOS 5 MG CAPSULE
ORAL_CAPSULE | Freq: Every day | ORAL | 2 refills | 0.00 days | Status: CP
Start: 2023-07-30 — End: ?

## 2023-08-01 ENCOUNTER — Inpatient Hospital Stay: Admit: 2023-08-01 | Discharge: 2023-08-01 | Payer: MEDICARE

## 2023-08-01 ENCOUNTER — Ambulatory Visit: Admit: 2023-08-01 | Discharge: 2023-08-01 | Payer: MEDICARE

## 2023-08-01 DIAGNOSIS — I447 Left bundle-branch block, unspecified: Principal | ICD-10-CM

## 2023-08-01 DIAGNOSIS — I421 Obstructive hypertrophic cardiomyopathy: Principal | ICD-10-CM

## 2023-08-14 ENCOUNTER — Ambulatory Visit
Admission: EM | Admit: 2023-08-14 | Discharge: 2023-08-14 | Disposition: A | Discharge status: Home / Self Care | Attending: Emergency Medicine | Admitting: Emergency Medicine

## 2023-08-14 DIAGNOSIS — M109 Gout, unspecified: Secondary | ICD-10-CM

## 2023-08-14 MED ORDER — PREDNISONE 10 MG (21) PO TBPK: ORAL_TABLET | Freq: Every day | ORAL | 0 refills | Status: DC

## 2023-08-14 NOTE — Discharge Instructions (Signed)
 Take the prednisone as directed.  Follow up with your primary care provider.

## 2023-08-14 NOTE — ED Triage Notes (Addendum)
 Patient to Urgent Care with complaints of right sided big toe pain (Radiates into foot). Symptoms started yesterday.   Hx of gout.   Takes allopurinol as needed. Unable to get relief.

## 2023-08-14 NOTE — ED Provider Notes (Signed)
 [facility]    CSN: [id number] Arrival date & time: 08/14/23  1234      History   Chief Complaint Chief Complaint  Patient presents with   Toe Pain    HPI Christopher Nichols is a 38 y.o. male.  Patient presents with right great toe pain x 1 day.  Patient states this is similar to previous episodes of gout.  No trauma.  No numbness, weakness, paresthesias, wounds, fever.  He took allopurinol yesterday without relief.  He does not take allopurinol daily as prescribed.  The history is provided by the patient and medical records.    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 Authorizing Provider  predniSONE  (STERAPRED UNI-PAK 21 TAB) 10 MG (21) TBPK tablet Take by mouth daily. As directed 08/14/23  Yes Wellington Half, NP  allopurinol (ZYLOPRIM) 100 MG tablet Take 100 mg by mouth daily. 11/05/22   [provider]  benzonatate  (TESSALON ) 100 MG capsule Take 1 capsule (100 mg total) by mouth 3 (three) times daily as needed for cough. Patient not taking: Reported on 08/14/2023 02/12/23   Wellington Half, NP  CAMZYOS 5 MG CAPS capsule Take 5 mg by mouth daily.    [provider]  meloxicam  (MOBIC ) 15 MG tablet Take 1 tablet (15 mg total) by mouth  daily. 11/02/22 11/02/23  Cuthriell, Ardath Bears, PA-C  metoprolol  succinate (TOPROL -XL) 50 MG 24 hr tablet Take 50 mg by mouth daily. Take with or immediately following a meal.    [provider]  promethazine -dextromethorphan (PROMETHAZINE -DM) 6.25-15 MG/5ML syrup Take 5 mLs by mouth 4 (four) times daily as needed. Patient not taking: Reported on 08/14/2023 02/12/23   Wellington Half, NP  verapamil  (CALAN ) 40 MG tablet Take 40 mg by mouth 3 (three) times daily.    [provider]    Family History Family History  Problem Relation Age of Onset   Healthy Mother    Healthy Father     Social History Social History   Tobacco Use   Smoking status: Never   Smokeless tobacco: Never  Substance Use Topics   Alcohol use: No   Drug use: No     Allergies   Clarithromycin   Review of Systems Review of Systems  Constitutional:  Negative for chills and fever.  Musculoskeletal:  Positive for arthralgias and gait problem. Negative for joint swelling.  Skin:  Negative for color change, rash and wound.  Neurological:  Negative for weakness and numbness.     Physical Exam Triage Vital Signs ED Triage Vitals  Encounter Vitals Group     BP 08/14/23 1257 110/65     Systolic BP Percentile --  Diastolic BP Percentile --      Pulse Rate 08/14/23 1257 77     Resp 08/14/23 1257 18     Temp 08/14/23 1257 98 F (36.7 C)     Temp src --      SpO2 08/14/23 1257 97 %     Weight --      Height --      Head Circumference --      Peak Flow --      Pain Score 08/14/23 1300 9     Pain Loc --      Pain Education --      Exclude from Growth Chart --    No data found.  Updated Vital Signs BP 110/65   Pulse 77   Temp 98 F (36.7 C)   Resp 18   SpO2 97%   Visual Acuity Right Eye Distance:   Left Eye Distance:   Bilateral Distance:    Right Eye Near:   Left Eye Near:    Bilateral Near:     Physical Exam Constitutional:      General: He is not in acute  distress. HENT:     Mouth/Throat:     Mouth: Mucous membranes are moist.  Cardiovascular:     Rate and Rhythm: Normal rate and regular rhythm.  Pulmonary:     Effort: Pulmonary effort is normal. No respiratory distress.  Musculoskeletal:        General: Tenderness present. No swelling or deformity. Normal range of motion.  Skin:    General: Skin is warm and dry.     Capillary Refill: Capillary refill takes less than 2 seconds.     Findings: No bruising, erythema, lesion or rash.  Neurological:     General: No focal deficit present.     Mental Status: He is alert.     Sensory: No sensory deficit.     Motor: No weakness.     Gait: Gait abnormal.     Comments: Limping gait.      UC Treatments / Results  Labs (all labs ordered are listed, but only abnormal results are displayed) Labs Reviewed - No data to display  EKG   Radiology No results found.  Procedures Procedures (including critical care time)  Medications Ordered in UC Medications - No data to display  Initial Impression / Assessment and Plan / UC Course  I have reviewed the triage vital signs and the nursing notes.  Pertinent labs & imaging results that were available during my care of the patient were reviewed by me and considered in my medical decision making (see chart for details).    Gout of right great toe.  Afebrile and vital signs are stable.  No trauma.  Treating today with prednisone  taper (colchicine  is contraindicated as patient is on verapamil ).  Education provided on gout.  Instructed patient to follow-up with his PCP.  He agrees to plan of care.  Final Clinical Impressions(s) / UC Diagnoses   Final diagnoses:  Acute gout involving toe of right foot, unspecified cause     Discharge Instructions      Take the prednisone  as directed.  Follow-up with your primary care provider.     ED Prescriptions     Medication Sig Dispense Auth. Provider   predniSONE  (STERAPRED UNI-PAK 21 TAB) 10  MG (21) TBPK tablet Take by mouth daily. As directed 21 tablet Wellington Half, NP      PDMP not reviewed this encounter.   Arabella Knife,  Adonis Hoot, NP 08/14/23 1343

## 2023-08-16 IMAGING — CT CT ANGIO CHEST
2 of 6 series · 17 of 46 positions shown · IV contrast (agent unspecified)
Comparison: Two-view chest x-ray 02/18/2017. CT of the chest
12/23/2010

CLINICAL DATA: Pulmonary embolism suspected, high probability.

EXAM:
CT ANGIOGRAPHY CHEST WITH CONTRAST
TECHNIQUE: Multidetector CT imaging of the chest was performed using the
standard protocol during bolus administration of intravenous
contrast. Multiplanar CT image reconstructions and MIPs were
obtained to evaluate the vascular anatomy.

[Series 7: thins · axial · 0.82mm/px · z∈[-601,-349]mm · 14 of 397 slices shown]
[im 18/397  lung]
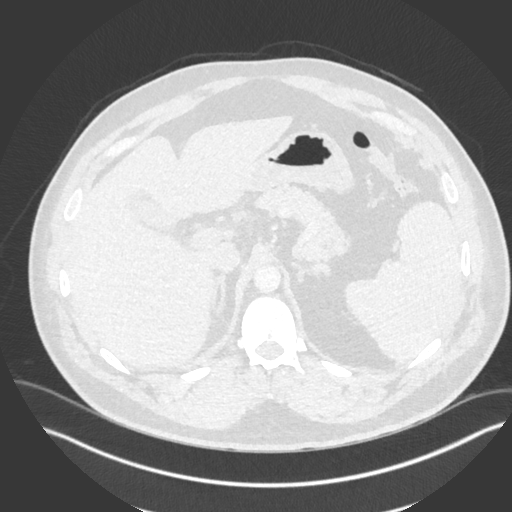
[im 52/397  soft-tissue]
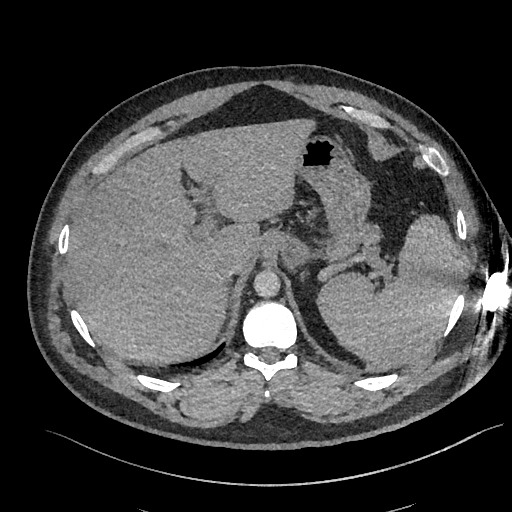
[im 69/397  lung]
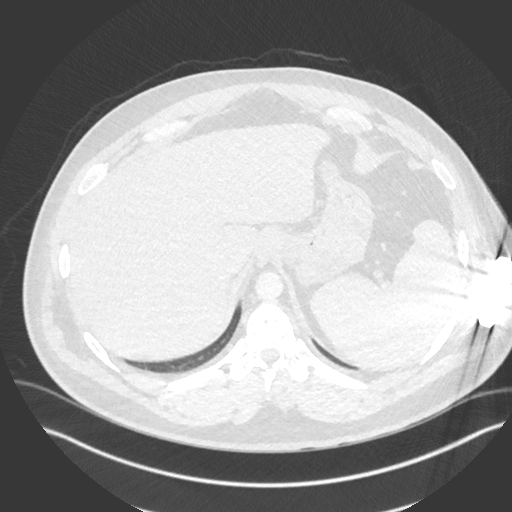
[im 104/397  soft-tissue]
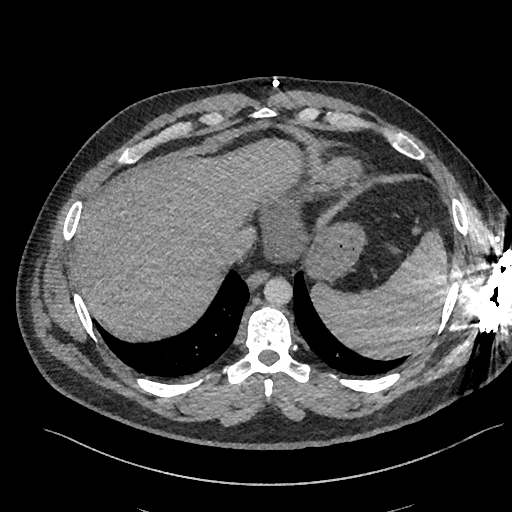
[im 138/397  lung]
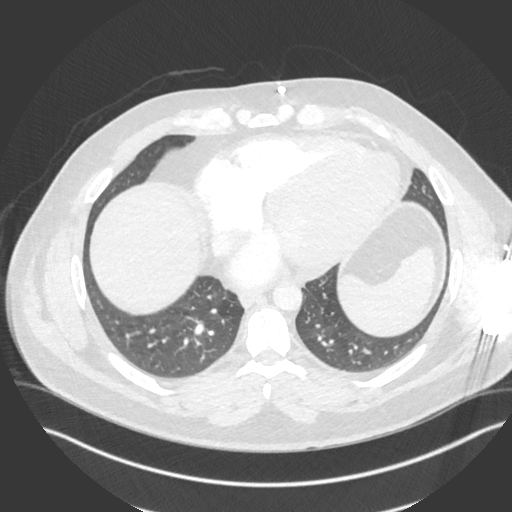
[im 155/397  soft-tissue]
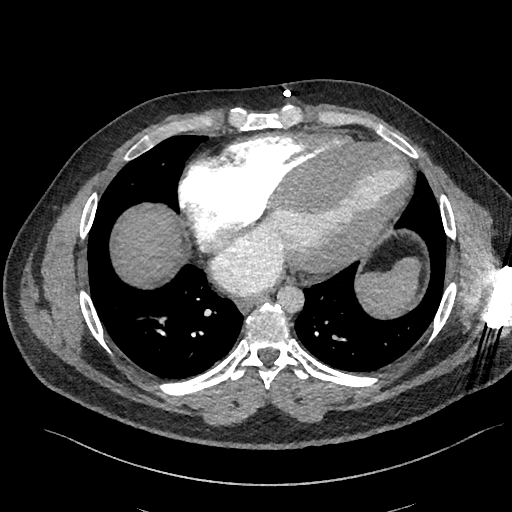
[im 190/397  lung]
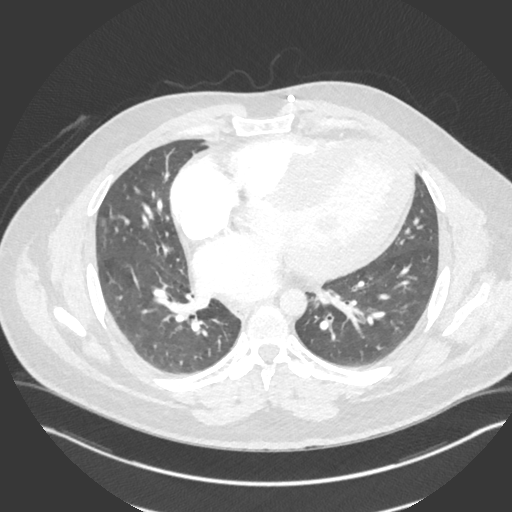
[im 207/397  soft-tissue]
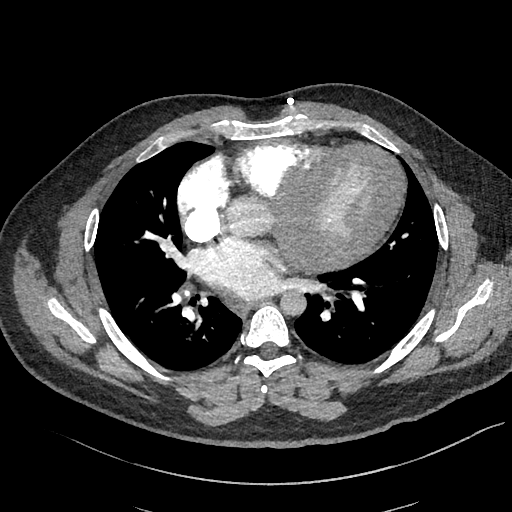
[im 242/397  lung]
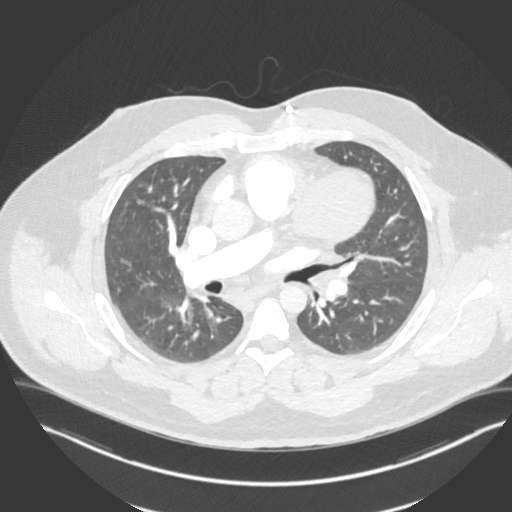
[im 259/397  soft-tissue]
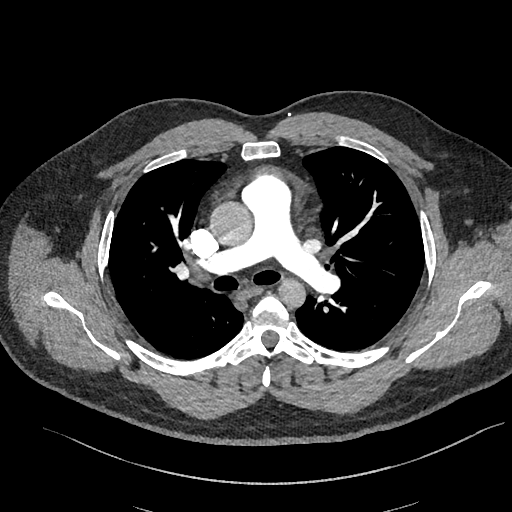
[im 293/397  lung]
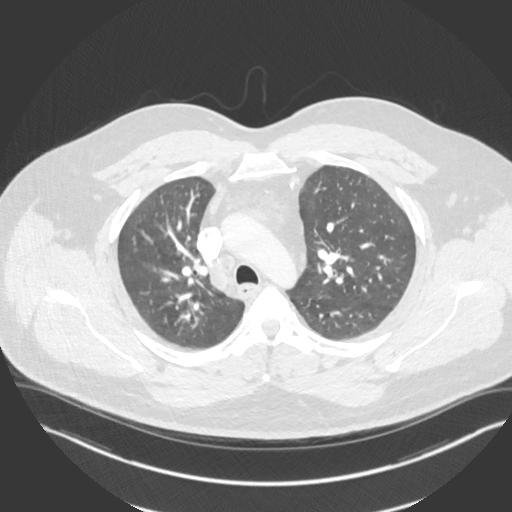
[im 328/397  soft-tissue]
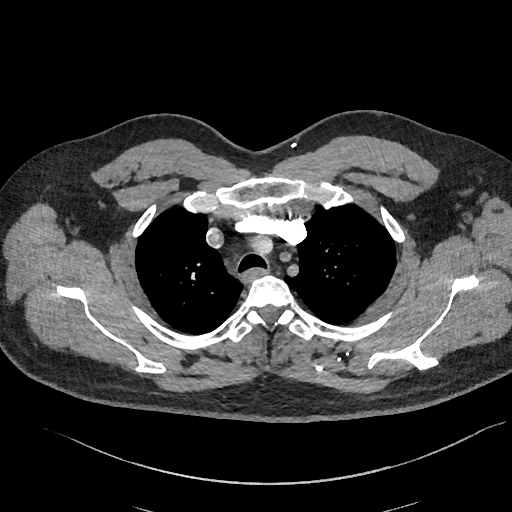
[im 345/397  lung]
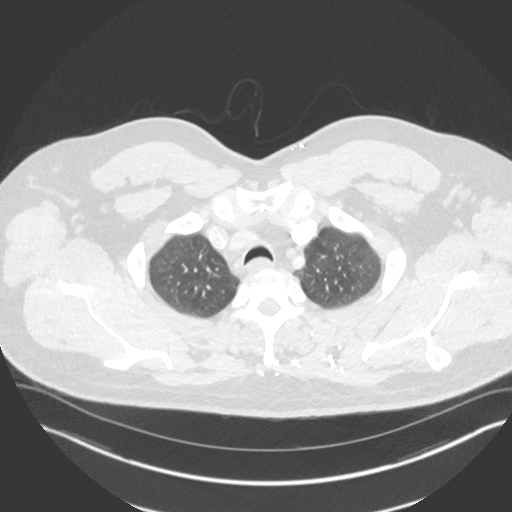
[im 379/397  soft-tissue]
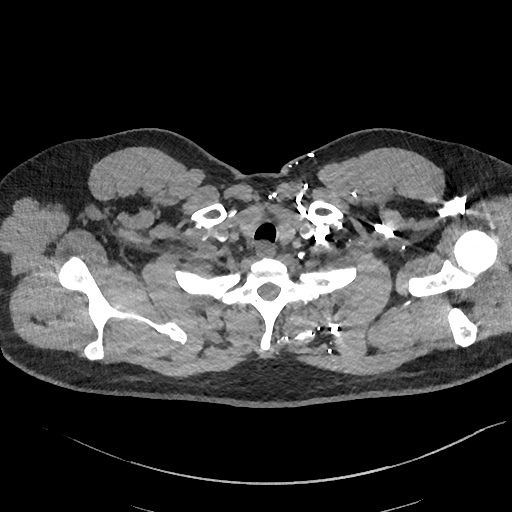

[Series 8: cor · coronal · 0.52mm/px · 3 of 161 slices shown]
[im 41/161  soft-tissue]
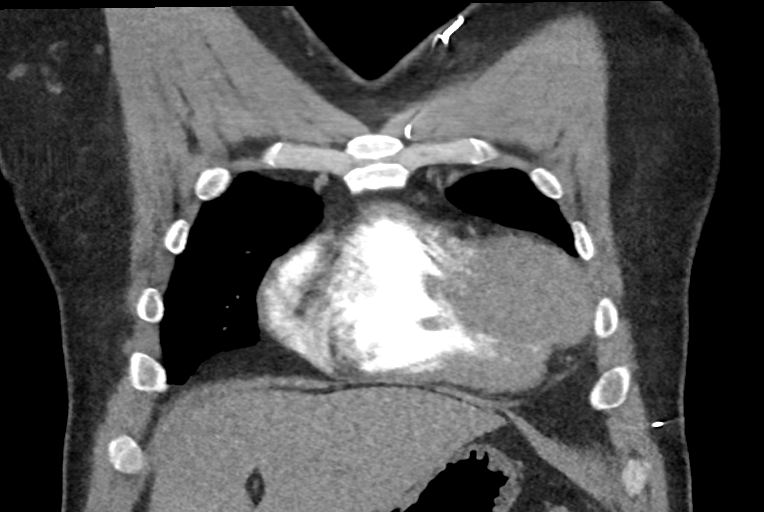
[im 81/161  soft-tissue]
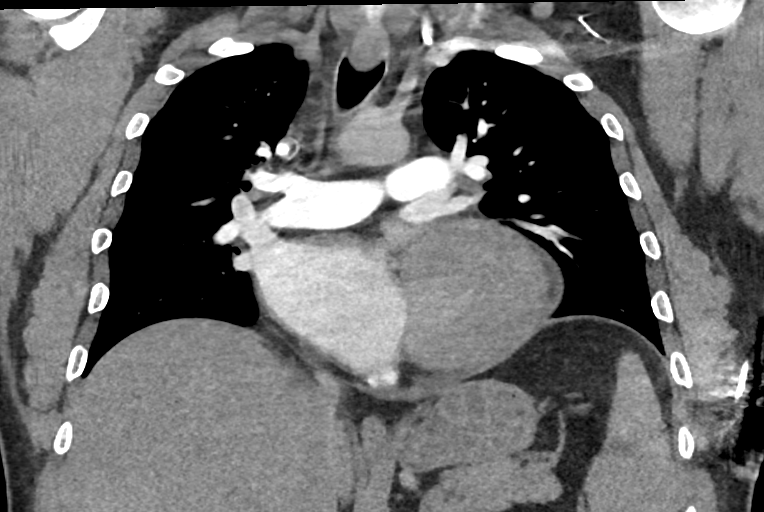
[im 121/161  soft-tissue]
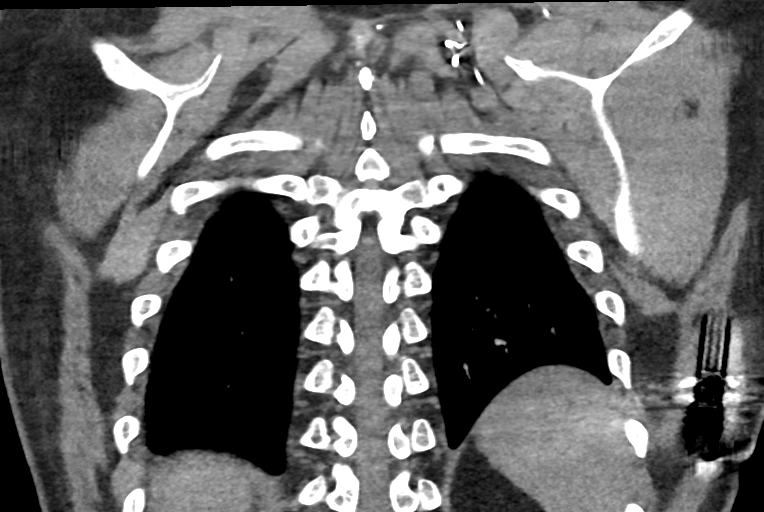

[17 of 46 positions shown; findings below may reference images not displayed]

RADIATION DOSE REDUCTION: This exam was performed according to the
departmental dose-optimization program which includes automated
exposure control, adjustment of the mA and/or kV according to
patient size and/or use of iterative reconstruction technique.

CONTRAST:  75mL OMNIPAQUE IOHEXOL 350 MG/ML SOLN
FINDINGS: Cardiovascular: The heart is enlarged. Aorta and great vessels are
within normal limits.

Pulmonary artery opacification is excellent. No focal filling
defects are present to suggest pulmonary embolus.

Mediastinum/Nodes: No enlarged mediastinal, hilar, or axillary lymph
nodes. Thyroid gland, trachea, and esophagus demonstrate no
significant findings.

Lungs/Pleura: Patchy areas of ground-glass attenuation are present
in both lungs. No significant consolidation is present. No nodule or
mass lesion is present. No significant pleural disease is present.

Upper Abdomen: Visualized upper abdomen is within normal limits.

Musculoskeletal: No chest wall abnormality. No acute or significant
osseous findings.

Review of the MIP images confirms the above findings.
IMPRESSION: 1. No pulmonary embolus.
2. Cardiomegaly without failure.
3. Patchy areas of ground-glass attenuation in both lungs compatible
with edema or atypical infection.

## 2023-08-17 IMAGING — NM NM HEPATOBILIARY IMAGE, INC GB
1 series · 6 of 6 positions shown · non-contrast
Comparison: Abdominal sonogram 08/16/21.

CLINICAL DATA: Right upper quadrant pain. Nondiagnostic ultrasound.
Rule out cholecystitis.

EXAM:
NUCLEAR MEDICINE HEPATOBILIARY IMAGING
TECHNIQUE: Sequential images of the abdomen were obtained [DATE] minutes
following intravenous administration of radiopharmaceutical.
RADIOPHARMACEUTICALS:  5.4 mCi Cc-00m  Choletec IV

[Series 1000: hepatobiliary scan · 9.59mm/px · 6 of 60 frames shown]
[frame 6/60]
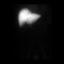
[frame 16/60]
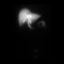
[frame 26/60]
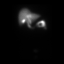
[frame 36/60]
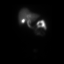
[frame 46/60]
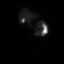
[frame 56/60]
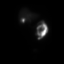

[6 of 6 positions shown; findings below may reference images not displayed]

FINDINGS: Prompt uptake and biliary excretion of activity by the liver is
seen. Gallbladder activity is visualized, consistent with patency of
cystic duct. Biliary activity passes into small bowel, consistent
with patent common bile duct.
IMPRESSION: 1. Normal exam.

## 2023-08-21 ENCOUNTER — Ambulatory Visit
Admit: 2023-08-21 | Discharge: 2023-08-22 | Payer: MEDICARE | Attending: Hematology & Oncology | Primary: Hematology & Oncology

## 2023-08-21 ENCOUNTER — Inpatient Hospital Stay: Admit: 2023-08-21 | Discharge: 2023-08-22 | Payer: MEDICARE

## 2023-08-21 DIAGNOSIS — M79674 Pain in right toe(s): Principal | ICD-10-CM

## 2023-08-21 DIAGNOSIS — M109 Gout, unspecified: Principal | ICD-10-CM

## 2023-08-21 MED ORDER — METHYLPREDNISOLONE 4 MG TABLETS IN A DOSE PACK
ORAL | 0 refills | 0.00000 days | Status: CP
Start: 2023-08-21 — End: ?

## 2023-09-18 IMAGING — DX DG CHEST 1V PORT
1 series · 2 of 2 positions shown · non-contrast
Comparison: Previous chest radiographs done on 02/18/2017 and CT
chest done on 08/16/2021

CLINICAL DATA: Bradycardia, dizziness, syncope

EXAM:
PORTABLE CHEST 1 VIEW

[Series 1: chest ap · 0.14mm/px · 2 of 2 slices shown]
[im 1/2]
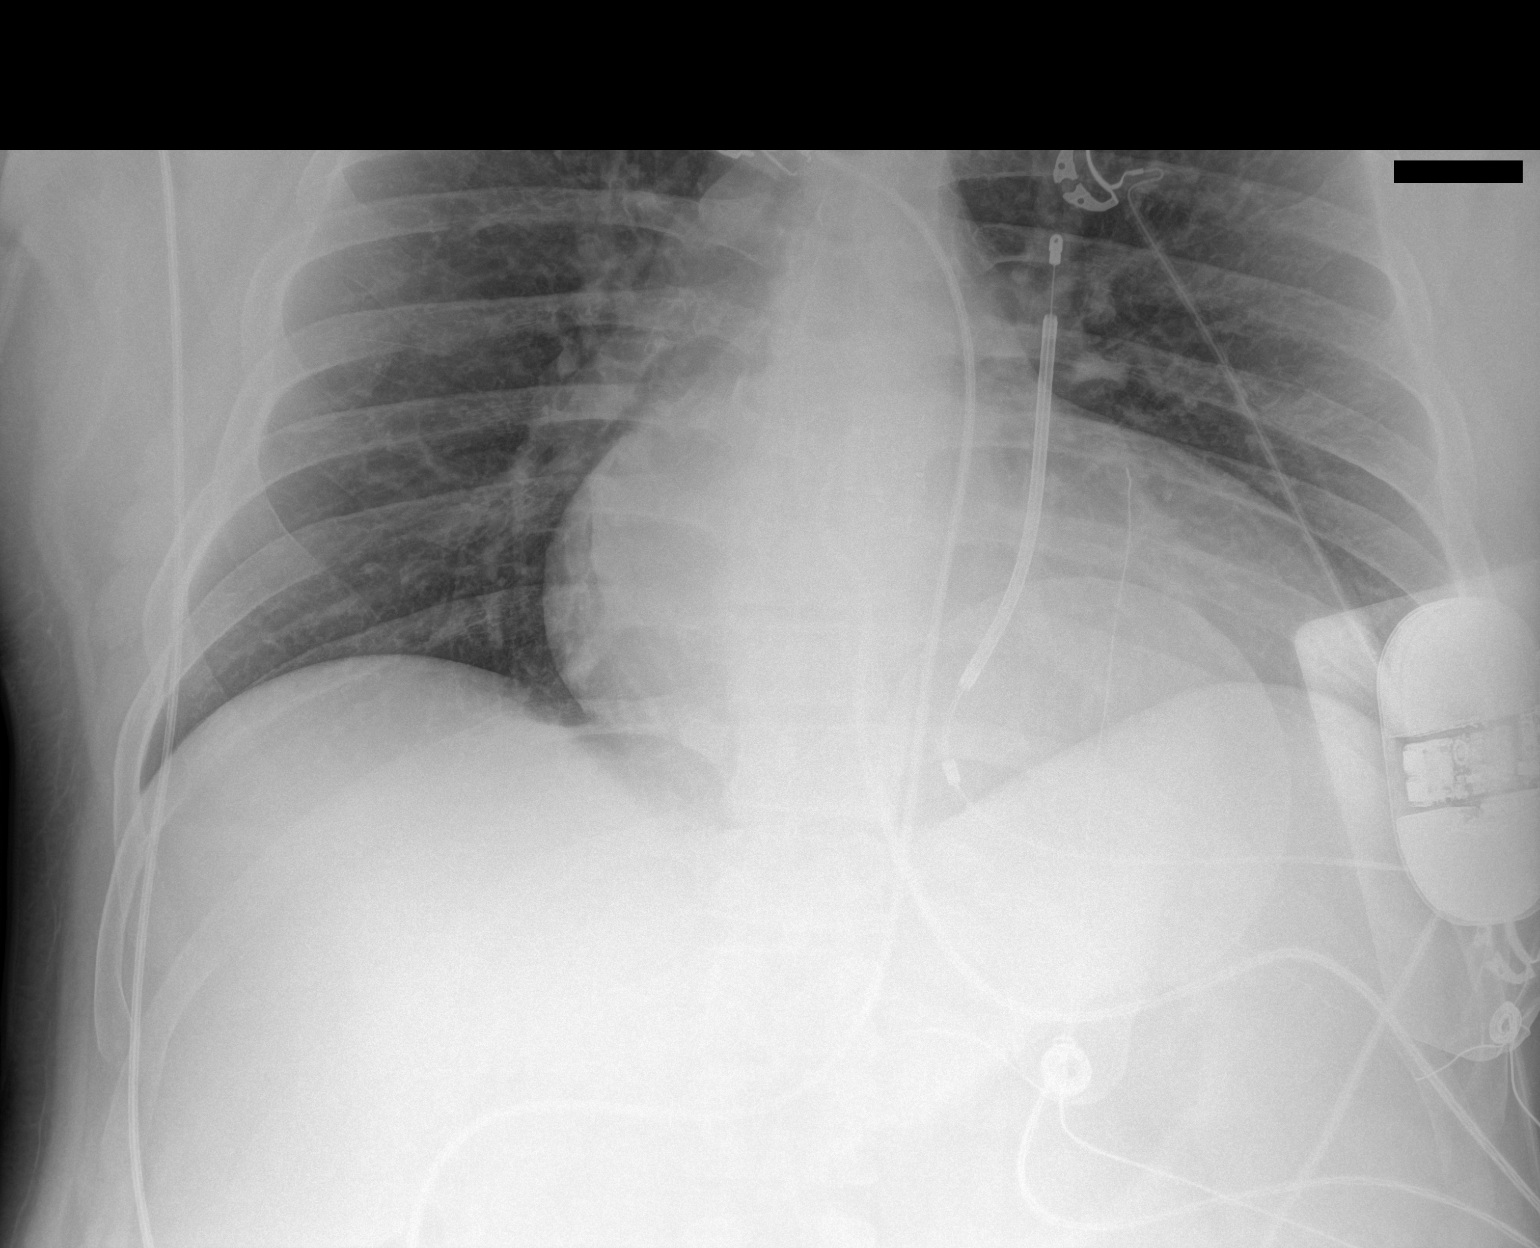
[im 2/2]
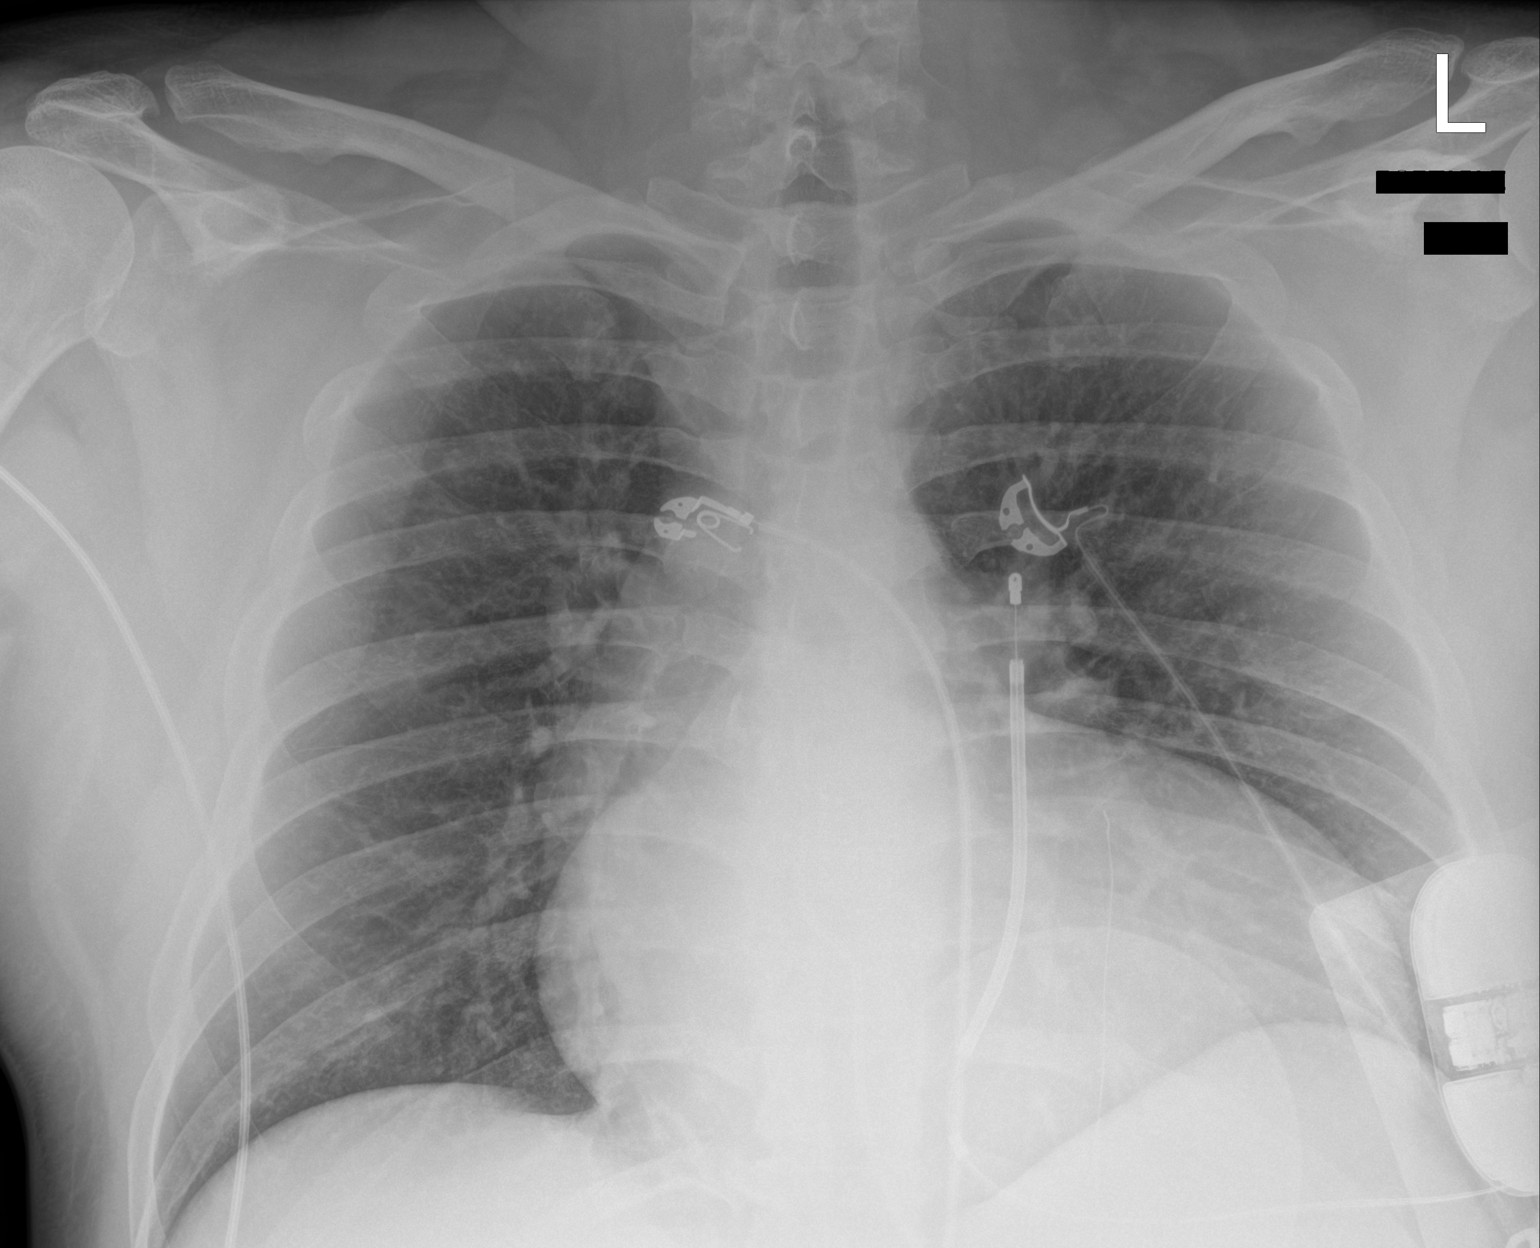

[2 of 2 positions shown; findings below may reference images not displayed]

FINDINGS: Transverse diameter of heart is increased. There is lead in the left
side of chest. There are no signs of alveolar pulmonary edema or
focal pulmonary consolidation.
IMPRESSION: Cardiomegaly. There are no signs of pulmonary edema or focal
pulmonary consolidation.

## 2023-10-30 DIAGNOSIS — I421 Obstructive hypertrophic cardiomyopathy: Principal | ICD-10-CM

## 2023-10-30 MED ORDER — CAMZYOS 5 MG CAPSULE
ORAL_CAPSULE | Freq: Every day | ORAL | 2 refills | 0.00000 days
Start: 2023-10-30 — End: ?

## 2023-11-07 ENCOUNTER — Ambulatory Visit: Admit: 2023-11-07 | Discharge: 2023-11-07 | Payer: MEDICARE

## 2023-11-07 ENCOUNTER — Inpatient Hospital Stay: Admit: 2023-11-07 | Discharge: 2023-11-07 | Payer: MEDICARE

## 2023-11-07 DIAGNOSIS — I421 Obstructive hypertrophic cardiomyopathy: Principal | ICD-10-CM

## 2023-11-07 MED ORDER — CAMZYOS 5 MG CAPSULE
ORAL_CAPSULE | Freq: Every day | ORAL | 5 refills | 0.00000 days | Status: CP
Start: 2023-11-07 — End: ?

## 2023-11-07 MED ORDER — METOPROLOL TARTRATE 50 MG TABLET
ORAL_TABLET | Freq: Two times a day (BID) | ORAL | 3 refills | 135.00000 days | Status: CP
Start: 2023-11-07 — End: ?

## 2023-12-03 ENCOUNTER — Encounter: Admit: 2023-12-03 | Discharge: 2023-12-03 | Payer: MEDICARE

## 2023-12-03 DIAGNOSIS — R400 Somnolence: Principal | ICD-10-CM

## 2023-12-03 DIAGNOSIS — R0683 Snoring: Principal | ICD-10-CM

## 2023-12-03 DIAGNOSIS — M109 Gout, unspecified: Principal | ICD-10-CM

## 2023-12-03 DIAGNOSIS — Z0000 Encounter for general adult medical examination without abnormal findings: Principal | ICD-10-CM

## 2023-12-03 DIAGNOSIS — F70 Mild intellectual disabilities: Principal | ICD-10-CM

## 2023-12-03 DIAGNOSIS — I421 Obstructive hypertrophic cardiomyopathy: Principal | ICD-10-CM

## 2023-12-03 DIAGNOSIS — Z95810 Presence of automatic (implantable) cardiac defibrillator: Principal | ICD-10-CM

## 2023-12-03 DIAGNOSIS — Z13220 Encounter for screening for lipoid disorders: Principal | ICD-10-CM

## 2023-12-03 DIAGNOSIS — Z136 Encounter for screening for cardiovascular disorders: Principal | ICD-10-CM

## 2023-12-03 DIAGNOSIS — K760 Fatty (change of) liver, not elsewhere classified: Principal | ICD-10-CM

## 2023-12-03 DIAGNOSIS — Z131 Encounter for screening for diabetes mellitus: Principal | ICD-10-CM

## 2023-12-03 DIAGNOSIS — K219 Gastro-esophageal reflux disease without esophagitis: Principal | ICD-10-CM

## 2024-02-11 ENCOUNTER — Telehealth: Payer: Self-pay

## 2024-02-11 ENCOUNTER — Ambulatory Visit (INDEPENDENT_AMBULATORY_CARE_PROVIDER_SITE_OTHER)

## 2024-02-11 ENCOUNTER — Ambulatory Visit
Admission: EM | Admit: 2024-02-11 | Discharge: 2024-02-11 | Disposition: A | Discharge status: Home / Self Care | Attending: Emergency Medicine | Admitting: Emergency Medicine

## 2024-02-11 DIAGNOSIS — M25562 Pain in left knee: Secondary | ICD-10-CM

## 2024-02-11 MED ORDER — PREDNISONE 10 MG (21) PO TBPK: ORAL_TABLET | Freq: Every day | ORAL | 0 refills | Status: DC

## 2024-02-11 NOTE — ED Provider Notes (Signed)
 Christopher Nichols    CSN: [id number] Arrival date & time: 02/11/24  9183      History   Chief Complaint Chief Complaint  Patient presents with   Knee Pain    HPI Christopher Nichols is a 38 y.o. male.  Patient presents with left knee pain since yesterday morning.  He woke up with pain and stiffness in his left knee.  No falls or injury.  The pain is worse with palpation, ambulation, weightbearing; improves with rest.  No fever, bruising, redness, wounds, numbness, weakness.  He took oxycodone last night which was leftover from a previous illness.  His medical history includes gout.  The history is provided by the patient and medical records.    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 Authorizing Provider  predniSONE  (STERAPRED UNI-PAK 21 TAB) 10 MG (21) TBPK tablet Take by mouth daily. As directed 02/11/24  Yes Corlis Burnard DEL, NP  allopurinol (ZYLOPRIM) 100 MG tablet Take 100 mg by mouth daily. 11/05/22   [provider]  benzonatate  (TESSALON ) 100 MG capsule Take 1 capsule (100 mg total) by mouth 3 (three) times daily as needed for cough. Patient not taking: Reported on 08/14/2023 02/12/23   Corlis Burnard DEL, NP  CAMZYOS 5 MG CAPS capsule Take 5 mg by  mouth daily.    [provider]  metoprolol  succinate (TOPROL -XL) 50 MG 24 hr tablet Take 50 mg by mouth daily. Take with or immediately following a meal.    [provider]  promethazine -dextromethorphan (PROMETHAZINE -DM) 6.25-15 MG/5ML syrup Take 5 mLs by mouth 4 (four) times daily as needed. Patient not taking: Reported on 08/14/2023 02/12/23   Corlis Burnard DEL, NP  verapamil  (CALAN ) 40 MG tablet Take 40 mg by mouth 3 (three) times daily.    [provider]    Family History Family History  Problem Relation Age of Onset   Healthy Mother    Healthy Father     Social History Social History   Tobacco Use   Smoking status: Never   Smokeless tobacco: Never  Substance Use Topics   Alcohol use: No   Drug use: No     Allergies   Clarithromycin   Review of Systems Review of Systems  Constitutional:  Negative for chills and fever.  Musculoskeletal:  Positive for arthralgias, gait problem and joint swelling.  Skin:  Negative for color change, rash and wound.  Neurological:  Negative for weakness and numbness.     Physical Exam Triage Vital Signs ED Triage Vitals  Encounter Vitals Group     BP 02/11/24 0907 129/86     Girls Systolic BP Percentile --  Girls Diastolic BP Percentile --      Boys Systolic BP Percentile --      Boys Diastolic BP Percentile --      Pulse Rate 02/11/24 0907 71     Resp 02/11/24 0907 18     Temp 02/11/24 0907 97.8 F (36.6 C)     Temp src --      SpO2 02/11/24 0907 97 %     Weight --      Height --      Head Circumference --      Peak Flow --      Pain Score 02/11/24 0853 8     Pain Loc --      Pain Education --      Exclude from Growth Chart --    No data found.  Updated Vital Signs BP 129/86   Pulse 71   Temp 97.8 F (36.6 C)   Resp 18   SpO2 97%   Visual Acuity Right Eye Distance:   Left Eye Distance:   Bilateral Distance:    Right Eye Near:   Left Eye Near:    Bilateral Near:     Physical  Exam Constitutional:      General: He is not in acute distress. HENT:     Mouth/Throat:     Mouth: Mucous membranes are moist.  Cardiovascular:     Rate and Rhythm: Normal rate.  Pulmonary:     Effort: Pulmonary effort is normal. No respiratory distress.  Musculoskeletal:        General: Swelling and tenderness present. No deformity. Normal range of motion.     Comments: Left knee mildly edematous, no erythema or ecchymosis, tender to palpation of anterior soft tissue on both sides of patella.  Skin:    General: Skin is warm and dry.     Capillary Refill: Capillary refill takes less than 2 seconds.     Findings: No bruising, erythema, lesion or rash.  Neurological:     General: No focal deficit present.     Mental Status: He is alert.     Sensory: No sensory deficit.     Motor: No weakness.     Gait: Gait abnormal.      UC Treatments / Results  Labs (all labs ordered are listed, but only abnormal results are displayed) Labs Reviewed - No data to display  EKG   Radiology DG Knee Complete 4 Views Left Result Date: 02/11/2024 CLINICAL DATA:  Left knee pain beginning yesterday.  No injury. EXAM: LEFT KNEE - COMPLETE 4+ VIEW COMPARISON:  11/02/2022 FINDINGS: No evidence of fracture, dislocation, or joint effusion. No evidence of arthropathy or other focal bone abnormality. Soft tissues are unremarkable. IMPRESSION: Negative. Electronically Signed   By: Toribio Agreste M.D.   On: 02/11/2024 10:04    Procedures Procedures (including critical care time)  Medications Ordered in UC Medications - No data to display  Initial Impression / Assessment and Plan / UC Course  I have reviewed the triage vital signs and the nursing notes.  Pertinent labs & imaging results that were available during my care of the patient were reviewed by me and considered in my medical decision making (see chart for details).    Left knee pain.  Xray negative.  Treating with knee sleeve, rest,  elevation.  Patient has history of gout.  Treating today with prednisone  taper.  Instructed patient to follow-up with an orthopedist.  Contact information for on-call Ortho provided.  Education  provided on knee pain.  He agrees to plan of care.  Final Clinical Impressions(s) / UC Diagnoses   Final diagnoses:  Acute pain of left knee     Discharge Instructions      Take the prednisone  as directed.  Wear the knee sleeve as directed.  Rest and elevate your knee.  Follow-up with an orthopedist such as the one listed below.     ED Prescriptions     Medication Sig Dispense Auth. Provider   predniSONE  (STERAPRED UNI-PAK 21 TAB) 10 MG (21) TBPK tablet Take by mouth daily. As directed 21 tablet Corlis Burnard DEL, NP      I have reviewed the PDMP during this encounter.   Corlis Burnard DEL, NP 02/11/24 1034

## 2024-02-11 NOTE — Discharge Instructions (Signed)
 Take the prednisone  as directed.  Wear the knee sleeve as directed.  Rest and elevate your knee.  Follow-up with an orthopedist such as the one listed below.

## 2024-02-11 NOTE — Telephone Encounter (Signed)
 Received phone call from patient stating that [facility] did not receive his prescription from his visit today.  [facility] contacted. Information verified. Prescription received via telephone order.  Patient notified. No further questions asked.

## 2024-02-11 NOTE — ED Triage Notes (Signed)
 Patient to Urgent Care with complaints of left sided knee pain (states it is under his knee cap, feels stiff).  Symptoms started Tuesday morning. Denies any known injury.  Oxycodone last night w/ some relief.

## 2024-02-19 ENCOUNTER — Encounter: Payer: Self-pay | Admitting: Physician Assistant

## 2024-02-19 ENCOUNTER — Ambulatory Visit (INDEPENDENT_AMBULATORY_CARE_PROVIDER_SITE_OTHER): Admitting: Physician Assistant

## 2024-02-19 DIAGNOSIS — M25462 Effusion, left knee: Secondary | ICD-10-CM

## 2024-02-19 NOTE — Progress Notes (Unsigned)
   Office Visit Note   Patient: Christopher Nichols           Date of Birth: Dec 15, 1985           MRN: [id number] Visit Date: 02/19/2024              Requested by: [facility] [address] [place],  [place] [address] PCP: [facility]   Assessment & Plan: Visit Diagnoses: No diagnosis found.  Plan: ***  Follow-Up Instructions: No follow-ups on file.   Orders:  No orders of the defined types were placed in this encounter.  No orders of the defined types were placed in this encounter.     Procedures: No procedures performed   Clinical Data: No additional findings.   Subjective: No chief complaint on file.   HPI  Review of Systems  All other systems reviewed and are negative.    Objective: Vital Signs: There were no vitals taken for this visit.  Physical Exam Constitutional:      Appearance: Normal appearance.  Pulmonary:     Effort: Pulmonary effort is normal.  Skin:    General: Skin is warm and dry.  Neurological:     Mental Status: He is alert.    Ortho Exam  Specialty Comments:  No specialty comments available.  Imaging: No results found.   PMFS History: Patient Active Problem List   Diagnosis Date Noted  . Obesity (BMI 30-39.9) 09/20/2021  . Near syncope 09/18/2021  . Symptomatic bradycardia   . Abnormal CT of the chest 08/17/2021  . Acute abdominal pain   . Acute duodenitis   . Abdominal pain 08/16/2021  . ICD (implantable cardioverter-defibrillator) in place 11/01/2020  . HOCM (hypertrophic obstructive cardiomyopathy) (HCC) 09/22/2012  . GERD 03/05/2007  . MALAISE AND FATIGUE 03/05/2007  . COUGH 03/05/2007   Past Medical History:  Diagnosis Date  . Cardiac defibrillator in place   . Cardiomyopathy (HCC)   . Gout   . Hypertension     Family History  Problem Relation Age of Onset  . Healthy Mother   . Healthy Father     Past Surgical History:  Procedure Laterality Date  . ICD IMPLANT     Social History    Occupational History  . Not on file  Tobacco Use  . Smoking status: Never  . Smokeless tobacco: Never  Substance and Sexual Activity  . Alcohol use: No  . Drug use: No  . Sexual activity: Not on file

## 2024-02-20 DIAGNOSIS — M25462 Effusion, left knee: Secondary | ICD-10-CM | POA: Diagnosis not present

## 2024-02-20 MED ORDER — LIDOCAINE HCL 1 % IJ SOLN
5.0000 mL | INTRAMUSCULAR | Status: AC | PRN
  Administered 2024-02-20: 5 mL

## 2024-02-20 MED ORDER — METHYLPREDNISOLONE ACETATE 40 MG/ML IJ SUSP
40.0000 mg | INTRAMUSCULAR | Status: AC | PRN
  Administered 2024-02-20: 40 mg via INTRA_ARTICULAR

## 2024-02-23 ENCOUNTER — Encounter: Payer: Self-pay | Admitting: Radiology

## 2024-02-25 LAB — SYNOVIAL FLUID ANALYSIS, COMPLETE
Basophils, %: 0 %
Eosinophils-Synovial: 0 % (ref 0–2)
Lymphocytes-Synovial Fld: 27 % (ref 0–74)
Monocyte/Macrophage: 28 % (ref 0–69)
Neutrophil, Synovial: 45 % — ABNORMAL HIGH (ref 0–24)
Synoviocytes, %: 0 % (ref 0–15)
WBC, Synovial: 126 {cells}/uL (ref <150)

## 2024-02-25 LAB — ANAEROBIC AND AEROBIC CULTURE
AER RESULT:: NO GROWTH
MICRO NUMBER:: 17171450
MICRO NUMBER:: 17171451
SPECIMEN QUALITY:: ADEQUATE
SPECIMEN QUALITY:: ADEQUATE

## 2024-05-01 NOTE — Progress Notes (Signed)
 [facility] [address] [place], [place] [address]  Pulmonary Sleep Medicine   Office Visit Note  Patient Name: Christopher Nichols DOB: 21-Dec-1985 MRN [id number]    Chief Complaint: Obstructive Sleep Apnea visit  Brief History:  Christopher Nichols is seen today for a follow up after setup on CPAP @ 11 cmH2O. The patient has a 4 month history of sleep apnea. Patient is using PAP nightly. The patient feels rested after sleeping with PAP.  The patient reports benefit from PAP use. Reported sleepiness is improved and the Epworth Sleepiness Score is 10 out of 24. The patient does not take naps. The patient complains of the following: dry mouth often, adjusted humidity and if issues persist he will call us . The compliance download shows 95% compliance with an average use time of 6 hours 38 minutes. The AHI is 3.9  The patient does not complain of limb movements disrupting sleep.  ROS  General: (-) fever, (-) chills, (-) night sweat Nose and Sinuses: (-) nasal stuffiness or itchiness, (-) postnasal drip, (-) nosebleeds, (-) sinus trouble. Mouth and Throat: (-) sore throat, (-) hoarseness. Neck: (-) swollen glands, (-) enlarged thyroid , (-) neck pain. Respiratory: - cough, - shortness of breath, - wheezing. Neurologic: - numbness, - tingling. Psychiatric: - anxiety, - depression   Current Medication: Outpatient Encounter Medications as of 05/03/2024  Medication Sig   allopurinol (ZYLOPRIM) 100 MG tablet Take 100 mg by mouth daily.   CAMZYOS 5 MG CAPS capsule Take 5 mg by mouth daily.   metoprolol  tartrate (LOPRESSOR ) 50 MG tablet Take 50 mg by mouth 2 (two) times daily.   [DISCONTINUED] benzonatate  (TESSALON ) 100 MG capsule Take 1 capsule (100 mg total) by mouth 3 (three) times daily as needed for cough. (Patient not taking: Reported on 08/14/2023)   [DISCONTINUED] metoprolol  succinate (TOPROL -XL) 50 MG 24 hr tablet Take 50 mg by mouth daily. Take with or immediately following a  meal.   [DISCONTINUED] predniSONE  (STERAPRED UNI-PAK 21 TAB) 10 MG (21) TBPK tablet Take by mouth daily. As directed   [DISCONTINUED] promethazine -dextromethorphan (PROMETHAZINE -DM) 6.25-15 MG/5ML syrup Take 5 mLs by mouth 4 (four) times daily as needed. (Patient not taking: Reported on 08/14/2023)   [DISCONTINUED] verapamil  (CALAN ) 40 MG tablet Take 40 mg by mouth 3 (three) times daily.   No facility-administered encounter medications on file as of 05/03/2024.    Surgical History: Past Surgical History:  Procedure Laterality Date   ICD IMPLANT      Medical History: Past Medical History:  Diagnosis Date   Cardiac defibrillator in place    Cardiomyopathy [facility])    Gout    Hypertension     Family History: Non contributory to the present illness  Social History: Social History   Socioeconomic History   Marital status: Married    Spouse name: Not on file   Number of children: Not on file   Years of education: Not on file   Highest education level: Not on file  Occupational History   Not on file  Tobacco Use   Smoking status: Never   Smokeless tobacco: Never  Substance and Sexual Activity   Alcohol use: No   Drug use: No   Sexual activity: Not on file  Other Topics Concern   Not on file  Social History Narrative   Not on file   Social Drivers of Health   Tobacco Use: Low Risk (05/03/2024)   Patient History    Smoking Tobacco Use: Never    Smokeless Tobacco  Use: Never    Passive Exposure: Not on file  Financial Resource Strain: Low Risk (12/02/2022)   Received from [facility]   Overall Financial Resource Strain (CARDIA)    Difficulty of Paying Living Expenses: Not hard at all  Food Insecurity: No Food Insecurity (12/03/2023)   Received from [facility]   Epic    Within the past 12 months, you worried that your food would run out before you got the money to buy more.: Never true    Within the past 12 months, the food you bought just didn't last and you  didn't have money to get more.: Never true  Transportation Needs: No Transportation Needs (12/03/2023)   Received from [facility]   PRAPARE - Transportation    Lack of Transportation (Medical): No    Lack of Transportation (Non-Medical): No  Physical Activity: Not on file  Stress: Not on file  Social Connections: Not on file  Intimate Partner Violence: Not At Risk (08/01/2023)   Received from [facility]   Epic    Within the last year, have you been afraid of your partner or ex-partner?: No    Within the last year, have you been humiliated or emotionally abused in other ways by your partner or ex-partner?: No    Within the last year, have you been kicked, hit, slapped, or otherwise physically hurt by your partner or ex-partner?: No    Within the last year, have you been raped or forced to have any kind of sexual activity by your partner or ex-partner?: No  Depression (PHQ2-9): Not on file  Alcohol Screen: Not on file  Housing: Not on file  Utilities: Low Risk (12/03/2023)   [facility]   Utilities    Within the past 12 months, have you been unable to get utilities(heat, electricity) when it was really needed?: No  Health Literacy: Not on file    Vital Signs: Blood pressure 124/78, pulse 69, resp. rate 20, height 5' 10 (1.778 m), weight 260 lb 1.6 oz (118 kg), SpO2 97%. Body mass index is 37.32 kg/m.    Examination: General Appearance: The patient is well-developed, well-nourished, and in no distress. Neck Circumference: 43 cm Skin: Gross inspection of skin unremarkable. Head: normocephalic, no gross deformities. Eyes: no gross deformities noted. ENT: ears appear grossly normal Neurologic: Alert and oriented. No involuntary movements.  STOP BANG RISK ASSESSMENT S (snore) Have you been told that you snore?     NO   T (tired) Are you often tired, fatigued, or sleepy during the day?   NO  O (obstruction) Do you stop breathing, choke, or gasp during  sleep? NO   P (pressure) Do you have or are you being treated for high blood pressure? YES   B (BMI) Is your body index greater than 35 kg/m? YES   A (age) Are you 47 years old or older? NO   N (neck) Do you have a neck circumference greater than 16 inches?   YES   G (gender) Are you a male? YES   TOTAL STOP/BANG YES ANSWERS 4       A STOP-Bang score of 2 or less is considered low risk, and a score of 5 or more is high risk for having either moderate or severe OSA. For people who score 3 or 4, doctors may need to perform further assessment to determine how likely they are to have OSA.  EPWORTH SLEEPINESS SCALE:  Scale:  (0)= no chance of dozing; (1)= slight chance of dozing; (2)= moderate chance of dozing; (3)= high chance of dozing  Chance  Situtation    Sitting and reading: 1    Watching TV: 1    Sitting Inactive in public: 1    As a passenger in car: 2      Lying down to rest: 3    Sitting and talking: 0    Sitting quielty after lunch: 2    In a car, stopped in traffic: 0   TOTAL SCORE:   10 out of 24    SLEEP STUDIES:  PSG (01/09/2024) AHI 43.1/hr, REM AHI 22.1/hr, Supine AHI 56.1/hr, min Sp02 64% Titration (01/30/2024) CPAP @ 11 cmH2O   CPAP COMPLIANCE DATA:  Date Range: 03/17/2024 - 04/29/2024  Average Daily Use: 6 hours 38 minutes  Median Use: 6 hours 52 minutes  Compliance for > 4 Hours: 95% days  AHI: 3.9 respiratory events per hour  Days Used: 44/44  Mask Leak: 20.5  95th Percentile Pressure: 11 cmH2O         LABS: Recent Results (from the past 2160 hours)  Synovial Fluid Analysis, Complete     Status: Abnormal   Collection Time: 02/19/24  4:33 PM  Result Value Ref Range   Site NOT GIVEN    Color, Synovial RED (A) STRAW/YELLOW   Appearance-Synovial CLOUDY (A) CLEAR/HAZY   WBC, Synovial 126 <150 cells/uL   Neutrophil, Synovial 45 (H) 0 - 24 %   Lymphocytes-Synovial Fld 27 0 - 74 %   Monocyte/Macrophage 28 0 - 69  %   Eosinophils-Synovial 0 0 - 2 %   Basophils, % 0 0 %   Synoviocytes, % 0 0 - 15 %   Crystals, Fluid  NONE SEEN /HPF    Comment: None seen  Anaerobic and Aerobic Culture     Status: Abnormal   Collection Time: 02/19/24  4:33 PM   Specimen: Synovial, Left Knee; Synovial Fluid  Result Value Ref Range   MICRO NUMBER: [id number]    SPECIMEN QUALITY: Adequate    Source: FLUID, SYNOVIAL    STATUS: FINAL    GRAM STAIN: (A)     Few White blood cells seen Many epithelial cells No organisms seen   ANA RESULT: No anaerobes isolated.    MICRO NUMBER: [id number]    SPECIMEN QUALITY: Adequate    SOURCE: FLUID, SYNOVIAL    STATUS: FINAL    AER RESULT: No Growth     Radiology: DG Knee Complete 4 Views Left Result Date: 02/11/2024 CLINICAL DATA:  Left knee pain beginning yesterday.  No injury. EXAM: LEFT KNEE - COMPLETE 4+ VIEW COMPARISON:  11/02/2022 FINDINGS: No evidence of fracture, dislocation, or joint effusion. No evidence of arthropathy or other focal bone abnormality. Soft tissues are unremarkable. IMPRESSION: Negative. Electronically Signed   By: Toribio Agreste M.D.   On: 02/11/2024 10:04    No results found.  No results found.    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 OSA (obstructive sleep apnea) (Primary)  The patient does tolerate PAP and reports  benefit from PAP use. He has hypertrophic cardiomyopathy so compliance and the risks of untreated apnea were  discussed with the patient and his sister who is his POA. The patient was reminded how to clean equipment and advised to replace supplies routinely. The patient was also counselled on weight loss. The compliance is excellent. The AHI is 3.9.   OSA on cpap- controlled. Continue with excellent compliance with pap. CPAP continues to be medically necessary to treat this patient's OSA. F/u 32m 2. CPAP use counseling CPAP Counseling: had a lengthy discussion with the patient regarding the importance of PAP therapy in management of the sleep apnea. Patient appears to understand the risk factor reduction and also understands the risks associated with untreated sleep apnea. Patient will try to make a good faith effort to remain compliant with therapy. Also instructed the patient on proper cleaning of the device including the water must be changed daily if possible and use of distilled water is preferred. Patient understands that the machine should be regularly cleaned with appropriate recommended cleaning solutions that do not damage the PAP machine for example given white vinegar and water rinses. Other methods such as ozone treatment may not be as good as these simple methods to achieve cleaning.    General Counseling: I have discussed the findings of the evaluation and examination with Christopher Nichols.  I have also discussed any further diagnostic evaluation thatmay be needed or ordered today. Nnaemeka verbalizes understanding of the findings of todays visit. We also reviewed his medications today and discussed drug interactions and side effects including but not limited excessive drowsiness and altered mental states. We also discussed that there is always a risk not just to him but also people around him. he has been encouraged to call the office with any questions or concerns that should arise related to todays visit.  No orders of the defined types were placed in this encounter.       I have personally  obtained a history, examined the patient, evaluated laboratory and imaging results, formulated the assessment and plan and placed orders. This patient was seen today by Lauraine Lay, PA-C in collaboration with Dr. Elfreda Bathe.   Elfreda DELENA Bathe, MD [facility] Diplomate ABMS Pulmonary Critical Care Medicine and Sleep Medicine

## 2024-05-03 ENCOUNTER — Ambulatory Visit: Payer: Self-pay | Admitting: Internal Medicine

## 2024-05-03 VITALS — BP 124/78 | HR 69 | Resp 20 | Ht 70.0 in | Wt 260.1 lb

## 2024-05-03 DIAGNOSIS — Z7189 Other specified counseling: Secondary | ICD-10-CM | POA: Diagnosis not present

## 2024-05-03 DIAGNOSIS — G4733 Obstructive sleep apnea (adult) (pediatric): Secondary | ICD-10-CM | POA: Diagnosis not present

## 2024-05-03 NOTE — Patient Instructions (Signed)

## 2024-05-07 ENCOUNTER — Ambulatory Visit: Admit: 2024-05-07 | Discharge: 2024-05-07 | Payer: BLUE CROSS/BLUE SHIELD

## 2024-05-07 ENCOUNTER — Inpatient Hospital Stay: Admit: 2024-05-07 | Discharge: 2024-05-07 | Payer: BLUE CROSS/BLUE SHIELD

## 2024-05-07 DIAGNOSIS — E7801 Homozygous familial hypercholesterolemia: Principal | ICD-10-CM

## 2024-05-07 DIAGNOSIS — I421 Obstructive hypertrophic cardiomyopathy: Principal | ICD-10-CM

## 2024-05-07 DIAGNOSIS — I5042 Chronic combined systolic (congestive) and diastolic (congestive) heart failure: Principal | ICD-10-CM

## 2024-05-07 DIAGNOSIS — R002 Palpitations: Principal | ICD-10-CM

## 2024-05-07 DIAGNOSIS — Z95810 Presence of automatic (implantable) cardiac defibrillator: Principal | ICD-10-CM

## 2024-05-17 DIAGNOSIS — I421 Obstructive hypertrophic cardiomyopathy: Principal | ICD-10-CM

## 2024-05-17 MED ORDER — CAMZYOS 5 MG CAPSULE
ORAL_CAPSULE | Freq: Every day | ORAL | 5 refills | 0.00000 days | Status: CP
Start: 2024-05-17 — End: ?
